# Patient Record
Sex: Male | Born: 1955 | Race: Black or African American | Hispanic: No | Marital: Married | Smoking: Never smoker
Health system: Southern US, Community
[De-identification: ages and names within clinical notes are randomized; demographics above are authoritative.]

## PROBLEM LIST (undated history)

## (undated) DIAGNOSIS — A150 Tuberculosis of lung: Secondary | ICD-10-CM

---

## 2008-07-27 ENCOUNTER — Emergency Department (HOSPITAL_COMMUNITY): Admission: EM | Admit: 2008-07-27 | Discharge: 2008-07-27 | Payer: Self-pay | Admitting: Emergency Medicine

## 2009-11-13 ENCOUNTER — Emergency Department (HOSPITAL_COMMUNITY): Admission: EM | Admit: 2009-11-13 | Discharge: 2009-11-13 | Payer: Self-pay | Admitting: Emergency Medicine

## 2009-12-26 ENCOUNTER — Emergency Department (HOSPITAL_COMMUNITY): Admission: EM | Admit: 2009-12-26 | Discharge: 2009-12-26 | Payer: Self-pay | Admitting: Emergency Medicine

## 2010-11-11 ENCOUNTER — Encounter: Payer: Self-pay | Admitting: Emergency Medicine

## 2011-03-24 ENCOUNTER — Emergency Department (HOSPITAL_COMMUNITY)
Admission: EM | Admit: 2011-03-24 | Discharge: 2011-03-24 | Disposition: A | Discharge status: Home / Self Care | Payer: BC Managed Care – PPO | Attending: Emergency Medicine | Admitting: Emergency Medicine

## 2011-03-24 DIAGNOSIS — M545 Low back pain, unspecified: Secondary | ICD-10-CM | POA: Insufficient documentation

## 2011-03-24 DIAGNOSIS — S335XXA Sprain of ligaments of lumbar spine, initial encounter: Secondary | ICD-10-CM | POA: Insufficient documentation

## 2011-03-24 DIAGNOSIS — X503XXA Overexertion from repetitive movements, initial encounter: Secondary | ICD-10-CM | POA: Insufficient documentation

## 2011-03-29 ENCOUNTER — Ambulatory Visit (INDEPENDENT_AMBULATORY_CARE_PROVIDER_SITE_OTHER): Payer: BC Managed Care – PPO

## 2011-03-29 ENCOUNTER — Inpatient Hospital Stay (INDEPENDENT_AMBULATORY_CARE_PROVIDER_SITE_OTHER)
Admission: RE | Admit: 2011-03-29 | Discharge: 2011-03-29 | Disposition: A | Discharge status: Home / Self Care | Payer: BC Managed Care – PPO | Source: Ambulatory Visit

## 2011-03-29 DIAGNOSIS — M799 Soft tissue disorder, unspecified: Secondary | ICD-10-CM

## 2011-03-29 DIAGNOSIS — M549 Dorsalgia, unspecified: Secondary | ICD-10-CM

## 2011-09-27 ENCOUNTER — Emergency Department (INDEPENDENT_AMBULATORY_CARE_PROVIDER_SITE_OTHER)
Admission: EM | Admit: 2011-09-27 | Discharge: 2011-09-27 | Disposition: A | Discharge status: Home / Self Care | Payer: BC Managed Care – PPO | Source: Home / Self Care | Attending: Emergency Medicine | Admitting: Emergency Medicine

## 2011-09-27 ENCOUNTER — Encounter: Payer: Self-pay | Admitting: *Deleted

## 2011-09-27 DIAGNOSIS — J069 Acute upper respiratory infection, unspecified: Secondary | ICD-10-CM

## 2011-09-27 MED ORDER — GUAIFENESIN-CODEINE 100-10 MG/5ML PO SYRP: 5.0000 mL | ORAL_SOLUTION | Freq: Three times a day (TID) | ORAL | Status: AC | PRN

## 2011-09-27 NOTE — ED Provider Notes (Addendum)
History     CSN: [id number] Arrival date & time: 09/27/2011 12:34 PM   First MD Initiated Contact with Patient 09/27/11 1215      Chief Complaint  Patient presents with  . Cough  . Chills  . Headache    (Consider location/radiation/quality/duration/timing/severity/associated sxs/prior treatment) HPI Comments: FOR 3 DAYS CONGESTED AND COUGHING UP YELLOW PHLEGM, HAVING SOME CHILLS NO SOB, some body aches and headaches".  Patient is a 55 y.o. male presenting with cough and headaches. The history is provided by the patient.  Cough This is a new problem. The current episode started more than 2 days ago. The problem occurs every few minutes. The cough is productive of sputum. The maximum temperature recorded prior to his arrival was 100 to 100.9 F. Associated symptoms include sweats, headaches, rhinorrhea and sore throat. Pertinent negatives include no chest pain, no ear pain, no shortness of breath and no wheezing. He has tried decongestants for the symptoms. The treatment provided mild relief. His past medical history does not include bronchitis, pneumonia, COPD, emphysema or asthma.  Headache The primary symptoms include headaches.    History reviewed. No pertinent past medical history.  History reviewed. No pertinent past surgical history.  History reviewed. No pertinent family history.  History  Substance Use Topics  . Smoking status: Never Smoker   . Smokeless tobacco: Not on file  . Alcohol Use: No      Review of Systems  Constitutional: Negative for diaphoresis and activity change.  HENT: Positive for sore throat and rhinorrhea. Negative for ear pain.   Respiratory: Positive for cough. Negative for shortness of breath and wheezing.   Cardiovascular: Negative for chest pain.  Neurological: Positive for headaches.    Allergies  Review of patient's allergies indicates no known allergies.  Home Medications  No current outpatient prescriptions on file.  There were  no vitals taken for this visit.  Physical Exam  Nursing note and vitals reviewed. Constitutional: He is oriented to person, place, and time. He appears well-developed and well-nourished.  HENT:  Head: Normocephalic.  Eyes: Pupils are equal, round, and reactive to light.  Neck: Normal range of motion. No JVD present.  Cardiovascular: Normal rate.   No murmur heard. Pulmonary/Chest: Effort normal and breath sounds normal. No respiratory distress. He has no wheezes.  Abdominal: Soft. He exhibits no distension.  Musculoskeletal: Normal range of motion.  Neurological: He is alert and oriented to person, place, and time.  Skin: Skin is warm. No erythema.    ED Course  Procedures (including critical care time)  Labs Reviewed - No data to display No results found.   No diagnosis found.    MDM  COUGH- X 3 DAYS WITH TACTILE FEVERS NO SOB- normal exam- mild ILI'S        Jimmie Molly, MD 09/27/11 1322    Jimmie Molly, MD 09/27/11 1329

## 2011-09-27 NOTE — ED Notes (Signed)
Pt is here with complaints of cough with yellow sputum, chills at night and HA X 3 days.

## 2012-01-18 IMAGING — CR DG CHEST 2V
2 series · 2 of 2 positions shown · non-contrast
Comparison: None

CLINICAL DATA: Productive cough and chest congestion.

CHEST - 2 VIEW

[w chest pa]
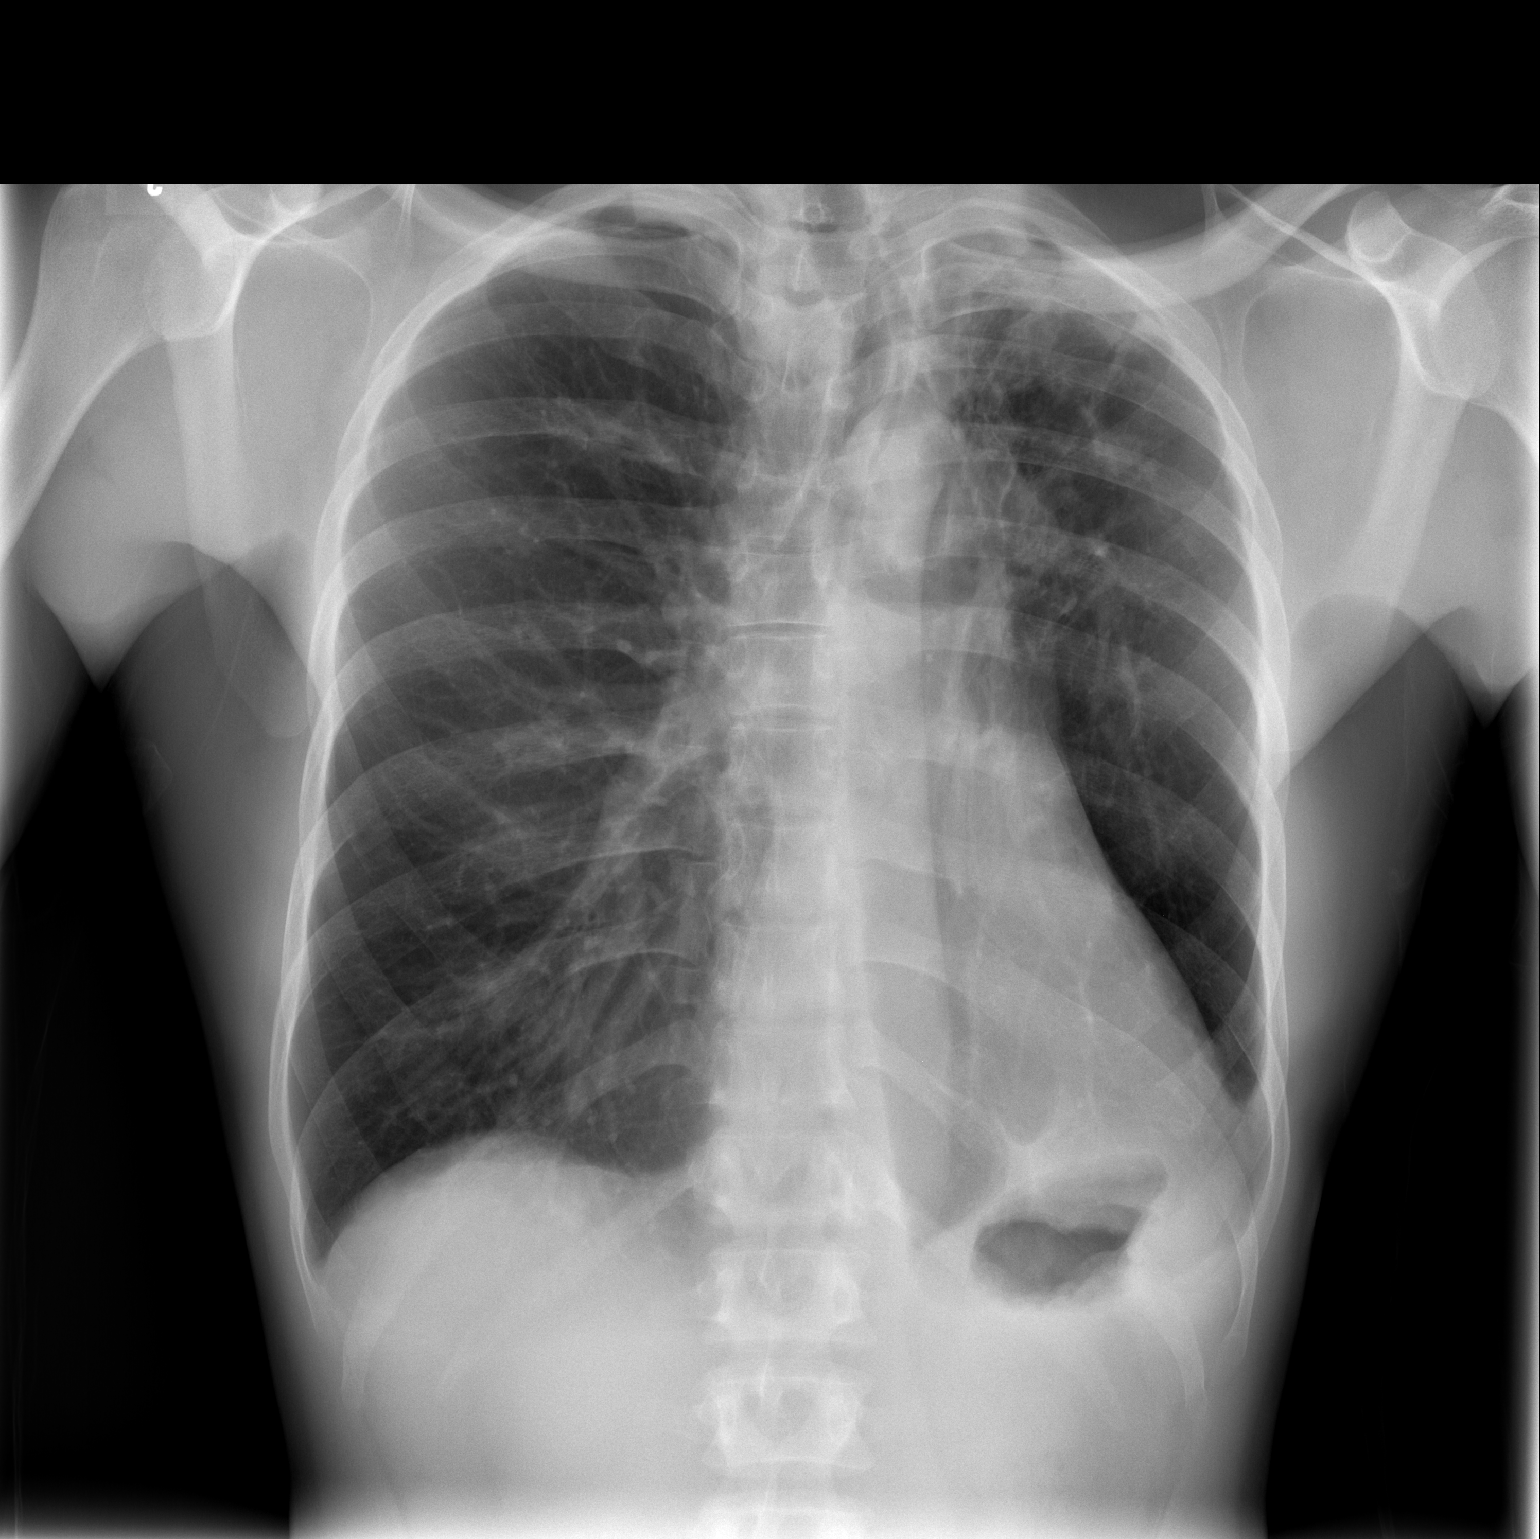

[w chest lat]
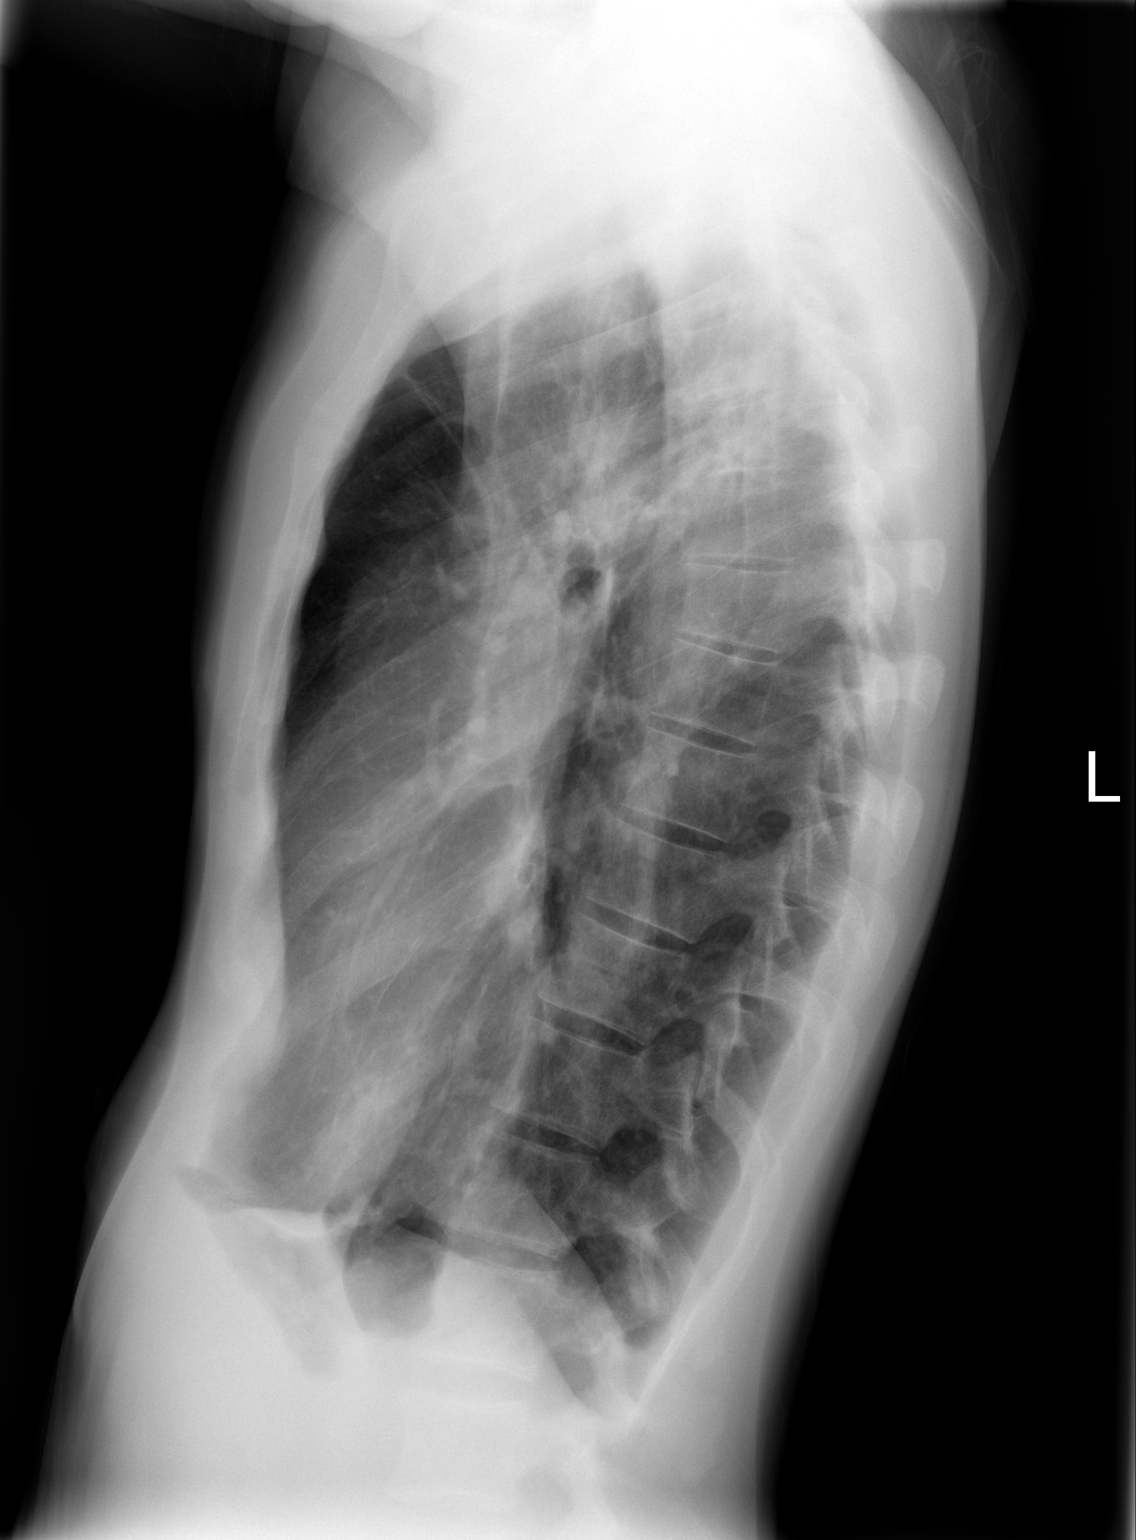

[2 of 2 positions shown; findings below may reference images not displayed]

FINDINGS: There is extensive scarring in the left upper lung zone
with some ill-defined vague nodular densities and superior
retraction of the left hilum and the left diaphragm.  The trachea
is retracted to the left.

Does the patient have a history of tuberculosis?

There is a vague 8 mm nodular appearing density at the right lung
base, nonspecific.

The heart size and pulmonary vascularity are normal.  No bony
abnormality.
IMPRESSION: 1.  Extensive scarring and possible acute infiltrate in the left
lung apex.  The possibility of tuberculosis should be considered.
2.  Small nodular density at the right lung base, nonspecific.  CT
scan may be helpful to further evaluate both processes.

## 2013-02-18 ENCOUNTER — Ambulatory Visit: Payer: Self-pay

## 2013-02-18 ENCOUNTER — Other Ambulatory Visit: Payer: Self-pay | Admitting: Occupational Medicine

## 2013-02-18 DIAGNOSIS — R52 Pain, unspecified: Secondary | ICD-10-CM

## 2014-08-29 ENCOUNTER — Emergency Department (HOSPITAL_COMMUNITY)
Admission: EM | Admit: 2014-08-29 | Discharge: 2014-08-29 | Disposition: A | Discharge status: Home / Self Care | Payer: BC Managed Care – PPO | Attending: Emergency Medicine | Admitting: Emergency Medicine

## 2014-08-29 ENCOUNTER — Encounter (HOSPITAL_COMMUNITY): Payer: Self-pay | Admitting: Neurology

## 2014-08-29 DIAGNOSIS — H6121 Impacted cerumen, right ear: Secondary | ICD-10-CM | POA: Insufficient documentation

## 2014-08-29 DIAGNOSIS — H9201 Otalgia, right ear: Secondary | ICD-10-CM

## 2014-08-29 NOTE — Discharge Instructions (Signed)
1. No q-tips in ears 2. Go to the pharmacy and use the OTC ear wax kits 3. Follow up with ENT (Dr. Lazarus SalinesWolicki) as needed 4. Come back if worsening symptoms Cerumen Impaction A cerumen impaction is when the wax in your ear forms a plug. This plug usually causes reduced hearing. Sometimes it also causes an earache or dizziness. Removing a cerumen impaction can be difficult and painful. The wax sticks to the ear canal. The canal is sensitive and bleeds easily. If you try to remove a heavy wax buildup with a cotton tipped swab, you may push it in further. Irrigation with water, suction, and small ear curettes may be used to clear out the wax. If the impaction is fixed to the skin in the ear canal, ear drops may be needed for a few days to loosen the wax. People who build up a lot of wax frequently can use ear wax removal products available in your local drugstore. SEEK MEDICAL CARE IF:  You develop an earache, increased hearing loss, or marked dizziness. Document Released: 11/14/2004 Document Revised: 12/30/2011 Document Reviewed: 01/04/2010 [facility] Patient Information 2015 [place], [place]. This information is not intended to replace advice given to you by your health care provider. Make sure you discuss any questions you have with your health care provider.

## 2014-08-29 NOTE — ED Notes (Signed)
Pt on continuous pulse oximetry and blood pressure cuff

## 2014-08-29 NOTE — ED Notes (Addendum)
Reports right ear pain since yesterday, no hearing loss. Has been using q-tips. Is a x 4

## 2014-08-29 NOTE — ED Provider Notes (Signed)
CSN: [id number]     Arrival date & time 08/29/14  [id number] History   First MD Initiated Contact with Patient 08/29/14 [id number]     No chief complaint on file.    (Consider location/radiation/quality/duration/timing/severity/associated sxs/prior Treatment) Patient is a 58 y.o. male presenting with ear pain. The history is provided by the patient. No language interpreter was used.  Otalgia Location:  Right Behind ear:  No abnormality Quality:  Aching and sharp Severity:  Moderate Onset quality:  Gradual Duration:  1 day Timing:  Constant Progression:  Unchanged Chronicity:  New Context: not direct blow, not elevation change, not foreign body in ear and no water in ear   Relieved by:  Nothing Worsened by:  Nothing tried Ineffective treatments:  None tried Associated symptoms: no abdominal pain, no congestion, no cough, no diarrhea, no ear discharge, no fever, no headaches, no hearing loss, no neck pain, no rash, no rhinorrhea, no sore throat, no tinnitus and no vomiting   Risk factors: no recent travel, no chronic ear infection and no prior ear surgery     No past medical history on file. No past surgical history on file. No family history on file. History  Substance Use Topics  . Smoking status: Never Smoker   . Smokeless tobacco: Not on file  . Alcohol Use: No    Review of Systems  Constitutional: Negative for fever.  HENT: Positive for ear pain. Negative for congestion, ear discharge, hearing loss, rhinorrhea, sore throat and tinnitus.   Respiratory: Negative for cough and shortness of breath.   Cardiovascular: Negative for chest pain.  Gastrointestinal: Negative for nausea, vomiting, abdominal pain and diarrhea.  Genitourinary: Negative for dysuria and hematuria.  Musculoskeletal: Negative for neck pain.  Skin: Negative for rash.  Neurological: Negative for syncope, light-headedness and headaches.  All other systems reviewed and are negative.     Allergies  Review of  patient's allergies indicates no known allergies.  Home Medications   Prior to Admission medications   Not on File   BP 130/79 mmHg  Pulse 77  Temp(Src) 97.7 F (36.5 C) (Oral)  Resp 18  SpO2 97% Physical Exam  Constitutional: He is oriented to person, place, and time. He appears well-developed and well-nourished.  HENT:  Head: Normocephalic and atraumatic.  Right Ear: External ear normal.  Left Ear: External ear normal.  Eyes: EOM are normal.  Neck: Normal range of motion. Neck supple.  Cardiovascular: Normal rate, regular rhythm and intact distal pulses.  Exam reveals no gallop and no friction rub.   No murmur heard. Pulmonary/Chest: Effort normal and breath sounds normal. No respiratory distress. He has no wheezes. He has no rales. He exhibits no tenderness.  Abdominal: Soft. Bowel sounds are normal. He exhibits no distension. There is no tenderness. There is no rebound.  Musculoskeletal: Normal range of motion. He exhibits no edema or tenderness.  Lymphadenopathy:    He has no cervical adenopathy.  Neurological: He is alert and oriented to person, place, and time.  Skin: Skin is warm. No rash noted.  Psychiatric: He has a normal mood and affect. His behavior is normal.  Nursing note and vitals reviewed.   ED Course  Procedures (including critical care time) Labs Review Labs Reviewed - No data to display  Imaging Review No results found.   EKG Interpretation None      MDM   Final diagnoses:  None    8:35 AM Pt is a 58 y.o. male with no pertinent PMHX who  presents to the ED with right ear pain. No URI or fever. No hearing loss. No focal neurologic abnormalities. Endorses right sided ear pain that started yesterday. Using q-tips on his ear. No trauma or injury. No swimming. No syncope or other symptoms. No history of immunosuppression  On exam: well appearing, no cervical adenopathy. No erythema of canal to suggest otitis externa. No adenopathy or infectious  symptoms to suggest otitis media. Noted cerumen impaction in right ear. Plan to have patient use OTC cerumen kits and avoid using q-tips in the future. No synocpe or neurologic dysfunction to suggest stroke or carotid bruits to suggest stenosis. No mastoid tenderness  Plan for discharge with close follow up with ENT as needed. Strict return precautions given.   8:43 AM:  I have discussed the diagnosis/risks/treatment options with the patient and believe the pt to be eligible for discharge home to follow-up with PCP and ENT as needed. We also discussed returning to the ED immediately if new or worsening sx occur. We discussed the sx which are most concerning (e.g., worsening symptoms) that necessitate immediate return. Any new prescriptions provided to the patient are listed below.   New Prescriptions   No medications on file    The patient appears reasonably screened and/or stabilized for discharge and I doubt any other medical condition or other [facility] requiring further screening, evaluation or treatment in the ED at this time prior to discharge . Pt in agreement with discharge plan. Return precautions given. Pt discharged VSS  Pt was discussed with my attending, Dr. Alessandra Bevelsampos     Brenlyn Beshara Peter Tsuyako Jolley, MD 08/29/14 1512  Lyanne CoKevin M Campos, MD 08/29/14 [phone]

## 2014-11-25 ENCOUNTER — Emergency Department (HOSPITAL_COMMUNITY)
Admission: EM | Admit: 2014-11-25 | Discharge: 2014-11-25 | Disposition: A | Discharge status: Home / Self Care | Payer: BC Managed Care – PPO | Attending: Emergency Medicine | Admitting: Emergency Medicine

## 2014-11-25 ENCOUNTER — Encounter (HOSPITAL_COMMUNITY): Payer: Self-pay | Admitting: Emergency Medicine

## 2014-11-25 ENCOUNTER — Emergency Department (HOSPITAL_COMMUNITY): Payer: BC Managed Care – PPO

## 2014-11-25 DIAGNOSIS — R059 Cough, unspecified: Secondary | ICD-10-CM

## 2014-11-25 DIAGNOSIS — R05 Cough: Secondary | ICD-10-CM | POA: Diagnosis not present

## 2014-11-25 DIAGNOSIS — IMO0002 Reserved for concepts with insufficient information to code with codable children: Secondary | ICD-10-CM

## 2014-11-25 DIAGNOSIS — M779 Enthesopathy, unspecified: Secondary | ICD-10-CM | POA: Diagnosis not present

## 2014-11-25 DIAGNOSIS — A150 Tuberculosis of lung: Secondary | ICD-10-CM | POA: Insufficient documentation

## 2014-11-25 HISTORY — DX: Tuberculosis of lung: A15.0

## 2014-11-25 MED ORDER — NAPROXEN 500 MG PO TABS: 500.0000 mg | ORAL_TABLET | Freq: Two times a day (BID) | ORAL | Status: DC

## 2014-11-25 MED ORDER — ALBUTEROL SULFATE HFA 108 (90 BASE) MCG/ACT IN AERS: 2.0000 | INHALATION_SPRAY | RESPIRATORY_TRACT | Status: AC | PRN

## 2014-11-25 MED ORDER — BENZONATATE 100 MG PO CAPS: 100.0000 mg | ORAL_CAPSULE | Freq: Three times a day (TID) | ORAL | Status: AC

## 2014-11-25 NOTE — ED Provider Notes (Signed)
CSN: [id number]     Arrival date & time 11/25/14  [id number] History  This chart was scribed for Jaynie Crumble, PA-C, working with Derwood Kaplan, MD by Chestine Spore, ED Scribe. The patient was seen in room TR09C/TR09C at 9:17 AM.    Chief Complaint  Patient presents with  . Arm Pain  . URI     The history is provided by the patient. No language interpreter was used.    HPI Comments: Jerome Hampton is a 59 y.o. male who presents to the Emergency Department complaining of arm pain onset 1 week. He denies any injury. 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 He notes that the majority of his pain is in his elbow. He states that he is having associated symptoms of productive cough. He reports that his cough is productive of yellow sputum. He states that he has not tried any medications for his symptoms. He denies wrist pain, SOB, and any other symptoms. 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 He denies being a smoker.    History reviewed. No pertinent past medical history. History reviewed. No pertinent past surgical history. No family history on file. History  Substance Use Topics  . Smoking status: Never Smoker   . Smokeless tobacco: Not on file  . Alcohol Use: No    Review of Systems  Respiratory: Positive for cough. Negative for shortness of breath.   Musculoskeletal: Positive for arthralgias.  All other systems reviewed and are negative.     Allergies  Review of patient's allergies indicates no known allergies.  Home Medications   Prior to Admission medications   Not on File   BP 117/70 mmHg  Pulse 77  Temp(Src) 97.5 F (36.4 C) (Oral)  SpO2 100%  Physical Exam  Constitutional: He is oriented to person, place, and time. He appears well-developed and well-nourished. No distress.  HENT:  Head: Normocephalic and atraumatic.  Eyes: EOM are normal.  Neck: Neck supple. No tracheal deviation present.  Cardiovascular: Normal rate.    Pulmonary/Chest: Effort normal and breath sounds normal. No respiratory distress.  Musculoskeletal: Normal range of motion.  Right forearm and upper arm appeared to be normal. Elbow and wrist are normal. There is mild tenderness to palpation over bilateral forearm just distal to the elbow. There is no pain with range of motion of the elbow. There is pain with flexion at the elbow against resistance. No pain with extension of the elbow against resistance. Pain with forearm supination against resistance. There is no swelling, and distal radial pulses intact.  Neurological: He is alert and oriented to person, place, and time.  Skin: Skin is warm and dry.  Psychiatric: He has a normal mood and affect. His behavior is normal.  Nursing note and vitals reviewed.   ED Course  Procedures (including critical care time) DIAGNOSTIC STUDIES: Oxygen Saturation is 100% on room air, normal by my interpretation.    COORDINATION OF CARE: 9:23 AM-Discussed treatment plan which includes CXR, anti-inflammatory, and ace wrap with pt at bedside and pt agreed to plan.   Labs Review Labs Reviewed - No data to display  Imaging Review Dg Chest 2 View  11/25/2014   CLINICAL DATA:  Right arm pain, productive cough starting 1 week ago, history of TB  EXAM: CHEST  2 VIEW  COMPARISON:  11/13/2009  FINDINGS: Cardiomediastinal silhouette is stable. Again noted extensive pleural parenchymal scarring in left apex and left upper lobe medially.  No definite superimposed infiltrate or pulmonary edema. Hyperinflation again noted. Stable chronic blunting of the left costophrenic angle.  IMPRESSION: Hyperinflation again noted. Stable extensive pleural parenchymal scarring left apex in left upper lobe. Stable chronic blunting of left costophrenic angle. No definite superimposed infiltrate or pulmonary edema.   Electronically Signed   By: Natasha MeadLiviu  Pop M.D.   On: 11/25/2014 09:45     EKG Interpretation None      MDM   Final  diagnoses:  Tendonitis of elbow or forearm  Cough    patient is here with right forearm pain for a week, also with cough. I believe his forearm pain is from overuse at work. Possible tendonitis. No evidence of infection, no bony tenderness, full rom. No concern for dvt. 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 Most likely viral bronchitis. Home with inhaler, tessalon. He is afebrile, non toxic appearing. No distress.   Filed Vitals:   11/25/14 0900  BP: 117/70  Pulse: 77  Temp: 97.5 F (36.4 C)  TempSrc: Oral  SpO2: 100%    I personally performed the services described in this documentation, which was scribed in my presence. The recorded information has been reviewed and is accurate.    Lottie Musselatyana A Rael Tilly, PA-C 11/25/14 1121  Derwood KaplanAnkit Nanavati, MD 11/29/14 1106

## 2014-11-25 NOTE — ED Notes (Signed)
C/o right arm pain x 1 week. No known injury. Pain primarily in elbow. Also, c/o "coughing up cold" points to throat but denies sore throat or significant cough. Pt works as a [employer] doing some heavy lifting.

## 2014-11-25 NOTE — Discharge Instructions (Signed)
Rest your forearm, avoid heavy lifting, pushing, pulling for 1-2 wks. Ice. Elevate. Naprosyn for pain and inflammation. Take inhaler for cough. Tessalon for cough as needed. I think your cough is caused by a virus. Follow up with primary care doctor.    Cough, Adult  A cough is a reflex that helps clear your throat and airways. It can help heal the body or may be a reaction to an irritated airway. A cough may only last 2 or 3 weeks (acute) or may last more than 8 weeks (chronic).  CAUSES Acute cough:  Viral or bacterial infections. Chronic cough:  Infections.  Allergies.  Asthma.  Post-nasal drip.  Smoking.  Heartburn or acid reflux.  Some medicines.  Chronic lung problems (COPD).  Cancer. SYMPTOMS   Cough.  Fever.  Chest pain.  Increased breathing rate.  High-pitched whistling sound when breathing (wheezing).  Colored mucus that you cough up (sputum). TREATMENT   A bacterial cough may be treated with antibiotic medicine.  A viral cough must run its course and will not respond to antibiotics.  Your caregiver may recommend other treatments if you have a chronic cough. HOME CARE INSTRUCTIONS   Only take over-the-counter or prescription medicines for pain, discomfort, or fever as directed by your caregiver. Use cough suppressants only as directed by your caregiver.  Use a cold steam vaporizer or humidifier in your bedroom or home to help loosen secretions.  Sleep in a semi-upright position if your cough is worse at night.  Rest as needed.  Stop smoking if you smoke. SEEK IMMEDIATE MEDICAL CARE IF:   You have pus in your sputum.  Your cough starts to worsen.  You cannot control your cough with suppressants and are losing sleep.  You begin coughing up blood.  You have difficulty breathing.  You develop pain which is getting worse or is uncontrolled with medicine.  You have a fever. MAKE SURE YOU:   Understand these instructions.  Will watch  your condition.  Will get help right away if you are not doing well or get worse. Document Released: 04/05/2011 Document Revised: 12/30/2011 Document Reviewed: 04/05/2011 [facility] Patient Information 2015 [place], [place]. This information is not intended to replace advice given to you by your health care provider. Make sure you discuss any questions you have with your health care provider.  Tendinitis Tendinitis is swelling and inflammation of the tendons. Tendons are band-like tissues that connect muscle to bone. Tendinitis commonly occurs in the:   Shoulders (rotator cuff).  Heels (Achilles tendon).  Elbows (triceps tendon). CAUSES Tendinitis is usually caused by overusing the tendon, muscles, and joints involved. When the tissue surrounding a tendon (synovium) becomes inflamed, it is called tenosynovitis. Tendinitis commonly develops in people whose jobs require repetitive motions. SYMPTOMS  Pain.  Tenderness.  Mild swelling. DIAGNOSIS Tendinitis is usually diagnosed by physical exam. Your health care provider may also order X-rays or other imaging tests. TREATMENT Your health care provider may recommend certain medicines or exercises for your treatment. HOME CARE INSTRUCTIONS   Use a sling or splint for as long as directed by your health care provider until the pain decreases.  Put ice on the injured area.  Put ice in a plastic bag.  Place a towel between your skin and the bag.  Leave the ice on for 15-20 minutes, 3-4 times a day, or as directed by your health care provider.  Avoid using the limb while the tendon is painful. Perform gentle range of motion exercises only  as directed by your health care provider. Stop exercises if pain or discomfort increase, unless directed otherwise by your health care provider.  Only take over-the-counter or prescription medicines for pain, discomfort, or fever as directed by your health care provider. SEEK MEDICAL CARE IF:   Your  pain and swelling increase.  You develop new, unexplained symptoms, especially increased numbness in the hands. MAKE SURE YOU:   Understand these instructions.  Will watch your condition.  Will get help right away if you are not doing well or get worse. Document Released: 10/04/2000 Document Revised: 02/21/2014 Document Reviewed: 12/24/2010 [facility] Patient Information 2015 [place], [place]. This information is not intended to replace advice given to you by your health care provider. Make sure you discuss any questions you have with your health care provider.

## 2015-07-25 ENCOUNTER — Encounter: Payer: Self-pay | Admitting: Emergency Medicine

## 2020-08-07 ENCOUNTER — Emergency Department (HOSPITAL_COMMUNITY)
Admission: EM | Admit: 2020-08-07 | Discharge: 2020-08-07 | Disposition: A | Discharge status: Home / Self Care | Payer: 59 | Attending: Emergency Medicine | Admitting: Emergency Medicine

## 2020-08-07 ENCOUNTER — Encounter (HOSPITAL_COMMUNITY): Payer: Self-pay

## 2020-08-07 DIAGNOSIS — U071 COVID-19: Secondary | ICD-10-CM | POA: Diagnosis not present

## 2020-08-07 DIAGNOSIS — R6883 Chills (without fever): Secondary | ICD-10-CM | POA: Diagnosis present

## 2020-08-07 LAB — RESPIRATORY PANEL BY RT PCR (FLU A&B, COVID)
Influenza A by PCR: NEGATIVE
Influenza B by PCR: NEGATIVE
SARS Coronavirus 2 by RT PCR: POSITIVE — AB

## 2020-08-07 NOTE — Discharge Instructions (Addendum)
At this time there does not appear to be the presence of an emergent medical condition, however there is always the potential for conditions to change. Please read and follow the below instructions.  Please return to the Emergency Department immediately for any new or worsening symptoms. Please be sure to follow up with your Primary Care Provider within one week regarding your visit today; please call their office to schedule an appointment even if you are feeling better for a follow-up visit. You should receive a call from the monoclonal antibody infusion center either this evening or sometime tomorrow.  Please keep your phone on you and answer it when they call it will be from an unknown number.  If you qualify they may schedule you an appointment for infusion. As you are positive for COVID-19 please stay away from other people and avoid large crowds to stop the spread of this virus.  Please wear your mask when around others.  You must quarantine for 10 days which would be on October 28.  Go to the nearest Emergency Department immediately if: You have fever or chills You have trouble breathing. You have pain or pressure in your chest. You have confusion. You have bluish lips and fingernails. You have difficulty waking from sleep. You have any new/concerning or worsening of symptoms  Please read the additional information packets attached to your discharge summary.  Do not take your medicine if  develop an itchy rash, swelling in your mouth or lips, or difficulty breathing; call 911 and seek immediate emergency medical attention if this occurs.  You may review your lab tests and imaging results in their entirety on your MyChart account.  Please discuss all results of fully with your primary care provider and other specialist at your follow-up visit.  Note: Portions of this text may have been transcribed using voice recognition software. Every effort was made to ensure accuracy; however,  inadvertent computerized transcription errors may still be present.

## 2020-08-07 NOTE — ED Provider Notes (Signed)
[facility] EMERGENCY DEPARTMENT Provider Note   CSN: [id number] Arrival date & time: 08/07/20  0745     History Chief Complaint  Patient presents with  . Chills    Jerome Hampton is a 64 y.o. male occasional smoker otherwise healthy no daily medication use.  Patient reports that his daughter was positive for COVID-19 last week.  Patient reports that he had been feeling well but last night developed a single chill which prompted him to take a Covid test at home which was then positive.  Since that single chill yesterday he has been asymptomatic reports that he is feeling very well today he is awaiting his Covid test result.  Denies fever, headache, sore throat, loss of taste/smell, cough/shortness of breath, chest pain, abdominal pain, nausea/vomiting, diarrhea, extremity swelling/color change, body aches or any additional concerns.  HPI     Past Medical History:  Diagnosis Date  . TB (pulmonary tuberculosis)    Pt reports having active TB when he was younger    Patient Active Problem List   Diagnosis Date Noted  . TB (pulmonary tuberculosis)     History reviewed. No pertinent surgical history.     No family history on file.  Social History   Tobacco Use  . Smoking status: Never Smoker  Substance Use Topics  . Alcohol use: No  . Drug use: No    Home Medications Prior to Admission medications   Medication Sig Start Date End Date Taking? Authorizing Provider  albuterol (PROVENTIL HFA;VENTOLIN HFA) 108 (90 BASE) MCG/ACT inhaler Inhale 2 puffs into the lungs every 4 (four) hours as needed for wheezing or shortness of breath. 11/25/14   Kirichenko, Tatyana, PA-C  benzonatate (TESSALON) 100 MG capsule Take 1 capsule (100 mg total) by mouth every 8 (eight) hours. 11/25/14   Kirichenko, Lemont Fillers, PA-C  naproxen (NAPROSYN) 500 MG tablet Take 1 tablet (500 mg total) by mouth 2 (two) times daily. 11/25/14   Jaynie Crumble, PA-C    Allergies    Patient  has no known allergies.  Review of Systems   Review of Systems  Constitutional: Positive for chills (Single chill yesterday). Negative for fatigue and fever.  HENT: Negative.  Negative for sore throat.   Respiratory: Negative.  Negative for cough and shortness of breath.   Cardiovascular: Negative.  Negative for chest pain and leg swelling.  Gastrointestinal: Negative.  Negative for abdominal pain, diarrhea, nausea and vomiting.  Musculoskeletal: Negative.  Negative for arthralgias and myalgias.  Neurological: Negative.  Negative for headaches.    Physical Exam Updated Vital Signs BP 122/81 (BP Location: Right Arm)   Pulse 90   Temp 98.6 F (37 C) (Oral)   Resp 17   Ht 5\' 11"  (1.803 m)   Wt 65.3 kg   SpO2 100%   BMI 20.08 kg/m   Physical Exam Constitutional:      General: He is not in acute distress.    Appearance: Normal appearance. He is well-developed. He is not ill-appearing or diaphoretic.  HENT:     Head: Normocephalic and atraumatic.  Eyes:     General: Vision grossly intact. Gaze aligned appropriately.     Pupils: Pupils are equal, round, and reactive to light.  Neck:     Trachea: Trachea and phonation normal.  Cardiovascular:     Rate and Rhythm: Normal rate and regular rhythm.  Pulmonary:     Effort: Pulmonary effort is normal. No respiratory distress.     Breath sounds: Normal  breath sounds.  Abdominal:     General: There is no distension.     Palpations: Abdomen is soft.     Tenderness: There is no abdominal tenderness. There is no guarding or rebound.  Musculoskeletal:        General: Normal range of motion.     Cervical back: Normal range of motion.     Right lower leg: No edema.     Left lower leg: No edema.  Skin:    General: Skin is warm and dry.  Neurological:     Mental Status: He is alert.     GCS: GCS eye subscore is 4. GCS verbal subscore is 5. GCS motor subscore is 6.     Comments: Speech is clear and goal oriented, follows  commands Major Cranial nerves without deficit, no facial droop Moves extremities without ataxia, coordination intact  Psychiatric:        Behavior: Behavior normal.     ED Results / Procedures / Treatments   Labs (all labs ordered are listed, but only abnormal results are displayed) Labs Reviewed  RESPIRATORY PANEL BY RT PCR (FLU A&B, COVID) - Abnormal; Notable for the following components:      Result Value   SARS Coronavirus 2 by RT PCR POSITIVE (*)    All other components within normal limits    EKG None  Radiology No results found.  Procedures Procedures (including critical care time)  Medications Ordered in ED Medications - No data to display  ED Course  I have reviewed the triage vital signs and the nursing notes.  Pertinent labs & imaging results that were available during my care of the patient were reviewed by me and considered in my medical decision making (see chart for details).  Clinical Course as of Aug 08 1631  Mon Aug 07, 2020  1600 Brion Aliment NP   [BM]    Clinical Course User Index [BM] Elizabeth Palau   MDM Rules/Calculators/A&P                          Additional history obtained from: 1. Nursing notes from this visit. ----------------------- 64 year old male presented after having a positive home Covid test yesterday he is here for a confirmatory Covid test.  He tested himself after his daughter was positive for Covid last week and he experienced a single chill last night.  He is otherwise completely asymptomatic.  He reports he is feeling very well at the time my evaluation he has been waiting for room for over 8 hours.  The Covid test obtained on arrival is positive.  He denies any cough chest pain shortness of breath or any other symptoms today reports that he is feeling well.  On exam cranial nerves intact, no meningeal signs, airway is clear, cardiopulmonary examination within normal limits, abdomen soft nontender, neurovascular tact to all  4 extremities no evidence of DVT.  I discussed plan of care with patient, given lack of respiratory symptoms and reassuring cardiopulmonary exam low suspicion for pneumonia at this time do not feel patient would benefit from a chest x-ray at this time.  Patient is requesting discharge.  Will refer patient to monoclonal antibody infusion center given his age and history of occasional smoking, no other high risk factors.  He does not meet criteria for admission.  Vital signs at discharge are within normal limits without fever, tachycardia, hypoxia or hypotension.  He was noted to be tachycardic on arrival  this morning this resolved without intervention, he denies any fatigue chest pain or shortness of breath.  Doubt bacterial infection requiring antibiotics or ACS/PE or other emergent cardiothoracic pathologies at this time.  Suspect patient symptoms secondary to COVID-19 viral infection no further work-up is indicated at this time. - Secure chat with Nicolasa Ducking, NP, they will reach out to patient this evening or tomorrow to schedule monoclonal antibody infusion.  At this time there does not appear to be any evidence of an acute emergency medical condition and the patient appears stable for discharge with appropriate outpatient follow up. Diagnosis was discussed with patient who verbalizes understanding of care plan and is agreeable to discharge. I have discussed return precautions with patient who verbalizes understanding. Patient encouraged to follow-up with their PCP. All questions answered.  Patient's case discussed with Dr. Effie Shy who agrees with plan to discharge with follow-up.   Jerome Hampton was evaluated in Emergency Department on 08/07/2020 for the symptoms described in the history of present illness. He was evaluated in the context of the global COVID-19 pandemic, which necessitated consideration that the patient might be at risk for infection with the SARS-CoV-2 virus that causes  COVID-19. Institutional protocols and algorithms that pertain to the evaluation of patients at risk for COVID-19 are in a state of rapid change based on information released by regulatory bodies including the CDC and federal and state organizations. These policies and algorithms were followed during the patient's care in the ED.  Note: Portions of this report may have been transcribed using voice recognition software. Every effort was made to ensure accuracy; however, inadvertent computerized transcription errors may still be present. Final Clinical Impression(s) / ED Diagnoses Final diagnoses:  COVID-19 virus infection    Rx / DC Orders ED Discharge Orders    None       Elizabeth Palau 08/07/20 1633    Mancel Bale, MD 08/08/20 [phone]

## 2020-08-07 NOTE — ED Triage Notes (Signed)
Pt reports he felt a chill last night and took a home COVID test that resulted positive. Pt denies cough, sob, chest pain or any other symptoms. Pt a.o, resp e.u

## 2020-08-08 ENCOUNTER — Emergency Department (HOSPITAL_COMMUNITY)
Admission: EM | Admit: 2020-08-08 | Discharge: 2020-08-08 | Disposition: A | Discharge status: Home / Self Care | Payer: 59 | Attending: Emergency Medicine | Admitting: Emergency Medicine

## 2020-08-08 ENCOUNTER — Emergency Department (HOSPITAL_COMMUNITY): Payer: 59

## 2020-08-08 ENCOUNTER — Encounter (HOSPITAL_COMMUNITY): Payer: Self-pay

## 2020-08-08 ENCOUNTER — Other Ambulatory Visit: Payer: Self-pay

## 2020-08-08 DIAGNOSIS — R079 Chest pain, unspecified: Secondary | ICD-10-CM

## 2020-08-08 DIAGNOSIS — U071 COVID-19: Secondary | ICD-10-CM | POA: Insufficient documentation

## 2020-08-08 DIAGNOSIS — R0789 Other chest pain: Secondary | ICD-10-CM | POA: Diagnosis not present

## 2020-08-08 DIAGNOSIS — R6883 Chills (without fever): Secondary | ICD-10-CM | POA: Diagnosis present

## 2020-08-08 LAB — CBC
HCT: 36.4 % — ABNORMAL LOW (ref 39.0–52.0)
Hemoglobin: 11.7 g/dL — ABNORMAL LOW (ref 13.0–17.0)
MCH: 23.7 pg — ABNORMAL LOW (ref 26.0–34.0)
MCHC: 32.1 g/dL (ref 30.0–36.0)
MCV: 73.8 fL — ABNORMAL LOW (ref 80.0–100.0)
Platelets: 174 10*3/uL (ref 150–400)
RBC: 4.93 MIL/uL (ref 4.22–5.81)
RDW: 14.8 % (ref 11.5–15.5)
WBC: 5.2 10*3/uL (ref 4.0–10.5)
nRBC: 0 % (ref 0.0–0.2)

## 2020-08-08 LAB — BASIC METABOLIC PANEL
Anion gap: 12 (ref 5–15)
BUN: 23 mg/dL (ref 8–23)
CO2: 27 mmol/L (ref 22–32)
Calcium: 9.4 mg/dL (ref 8.9–10.3)
Chloride: 99 mmol/L (ref 98–111)
Creatinine, Ser: 1.48 mg/dL — ABNORMAL HIGH (ref 0.61–1.24)
GFR, Estimated: 50 mL/min — ABNORMAL LOW (ref >60)
Glucose, Bld: 114 mg/dL — ABNORMAL HIGH (ref 70–99)
Potassium: 4.1 mmol/L (ref 3.5–5.1)
Sodium: 138 mmol/L (ref 135–145)

## 2020-08-08 LAB — TROPONIN I (HIGH SENSITIVITY)
Troponin I (High Sensitivity): 36 ng/L — ABNORMAL HIGH (ref <18)
Troponin I (High Sensitivity): 31 ng/L — ABNORMAL HIGH (ref <18)

## 2020-08-08 LAB — RESPIRATORY PANEL BY RT PCR (FLU A&B, COVID)
Influenza A by PCR: NEGATIVE
Influenza B by PCR: NEGATIVE
SARS Coronavirus 2 by RT PCR: POSITIVE — AB

## 2020-08-08 NOTE — ED Triage Notes (Signed)
Patient c/o intermittent mid chest pain since yesterday. Patient reports that he had a Covid + home test yesterday. Patient denies any SOB or cough.

## 2020-08-08 NOTE — ED Notes (Signed)
I did EKG in triage at 10:05

## 2020-08-08 NOTE — Discharge Instructions (Addendum)
The monoclonal antibody infusion team will contact you to set up a time to get your infusion.  They have your information.  In the meantime I would recommend a multivitamin over-the-counter vitamin D and zinc supplement.  Tylenol as needed for the chills.  Return for any new or worse symptoms or for any difficulty breathing or increased shortness of breath.  Chest x-ray today with may be some early changes of Covid infection.  But your oxygen levels are fine.  Work-up for the chest pain without any significant abnormalities.

## 2020-08-08 NOTE — ED Provider Notes (Signed)
[facility]-EMERGENCY DEPT Provider Note   CSN: [id number] Arrival date & time: 08/08/20  3532     History Chief Complaint  Patient presents with  . Chest Pain    Jerome Hampton is a 64 y.o. male.  Patient with symptoms consistent with possible Covid infection starting 3 days ago.  Chills and discomfort in the chest.  Patient's daughter with positive diagnosis.  Patient had a positive diagnosis yesterday.  But I cannot see it in the computer system so we will recheck today.  Patient with a past history of tuberculosis when he was a child.  But apparently its been dormant.  Patient was also seen yesterday October 18 in the emergency department.  No labs were done at that time.  Patient is oxygen saturation is in the upper 90s here.        Past Medical History:  Diagnosis Date  . TB (pulmonary tuberculosis)    Pt reports having active TB when he was younger    Patient Active Problem List   Diagnosis Date Noted  . TB (pulmonary tuberculosis)     History reviewed. No pertinent surgical history.     Family History  Problem Relation Age of Onset  . Hypertension Mother     Social History   Tobacco Use  . Smoking status: Never Smoker  . Smokeless tobacco: Never Used  Vaping Use  . Vaping Use: Never used  Substance Use Topics  . Alcohol use: No  . Drug use: No    Home Medications Prior to Admission medications   Medication Sig Start Date End Date Taking? Authorizing Provider  acetaminophen (TYLENOL) 325 MG tablet Take 650 mg by mouth every 6 (six) hours as needed for mild pain, fever or headache.   Yes [provider]  albuterol (PROVENTIL HFA;VENTOLIN HFA) 108 (90 BASE) MCG/ACT inhaler Inhale 2 puffs into the lungs every 4 (four) hours as needed for wheezing or shortness of breath. Patient not taking: Reported on 08/08/2020 11/25/14   Jaynie Crumble, PA-C  benzonatate (TESSALON) 100 MG capsule Take 1 capsule (100 mg total) by  mouth every 8 (eight) hours. Patient not taking: Reported on 08/08/2020 11/25/14   Jaynie Crumble, PA-C  naproxen (NAPROSYN) 500 MG tablet Take 1 tablet (500 mg total) by mouth 2 (two) times daily. Patient not taking: Reported on 08/08/2020 11/25/14   Jaynie Crumble, PA-C    Allergies    Patient has no known allergies.  Review of Systems   Review of Systems  Constitutional: Positive for chills. Negative for fever.  HENT: Negative for congestion, rhinorrhea and sore throat.   Eyes: Negative for visual disturbance.  Respiratory: Negative for cough and shortness of breath.   Cardiovascular: Positive for chest pain. Negative for leg swelling.  Gastrointestinal: Negative for abdominal pain, diarrhea, nausea and vomiting.  Genitourinary: Negative for dysuria.  Musculoskeletal: Negative for back pain and neck pain.  Skin: Negative for rash.  Neurological: Negative for dizziness, light-headedness and headaches.  Hematological: Does not bruise/bleed easily.  Psychiatric/Behavioral: Negative for confusion.    Physical Exam Updated Vital Signs BP 116/85   Pulse 80   Temp 98.1 F (36.7 C) (Oral)   Resp 20   Ht 1.803 m (5\' 11" )   Wt 64.9 kg   SpO2 100%   BMI 19.94 kg/m   Physical Exam Vitals and nursing note reviewed.  Constitutional:      Appearance: He is well-developed.  HENT:     Head: Normocephalic and atraumatic.  Eyes:     Extraocular Movements: Extraocular movements intact.     Conjunctiva/sclera: Conjunctivae normal.     Pupils: Pupils are equal, round, and reactive to light.  Cardiovascular:     Rate and Rhythm: Normal rate and regular rhythm.     Heart sounds: No murmur heard.   Pulmonary:     Effort: Pulmonary effort is normal. No respiratory distress.     Breath sounds: Normal breath sounds.  Abdominal:     Palpations: Abdomen is soft.     Tenderness: There is no abdominal tenderness.  Musculoskeletal:        General: No swelling. Normal range of  motion.     Cervical back: Normal range of motion and neck supple.  Skin:    General: Skin is warm and dry.     Capillary Refill: Capillary refill takes less than 2 seconds.  Neurological:     General: No focal deficit present.     Mental Status: He is alert and oriented to person, place, and time.     Cranial Nerves: No cranial nerve deficit.     Sensory: No sensory deficit.     Motor: No weakness.     ED Results / Procedures / Treatments   Labs (all labs ordered are listed, but only abnormal results are displayed) Labs Reviewed  RESPIRATORY PANEL BY RT PCR (FLU A&B, COVID) - Abnormal; Notable for the following components:      Result Value   SARS Coronavirus 2 by RT PCR POSITIVE (*)    All other components within normal limits  BASIC METABOLIC PANEL - Abnormal; Notable for the following components:   Glucose, Bld 114 (*)    Creatinine, Ser 1.48 (*)    GFR, Estimated 50 (*)    All other components within normal limits  CBC - Abnormal; Notable for the following components:   Hemoglobin 11.7 (*)    HCT 36.4 (*)    MCV 73.8 (*)    MCH 23.7 (*)    All other components within normal limits  TROPONIN I (HIGH SENSITIVITY) - Abnormal; Notable for the following components:   Troponin I (High Sensitivity) 36 (*)    All other components within normal limits  TROPONIN I (HIGH SENSITIVITY) - Abnormal; Notable for the following components:   Troponin I (High Sensitivity) 31 (*)    All other components within normal limits    EKG EKG Interpretation  Date/Time:  Tuesday August 08 2020 10:22:11 EDT Ventricular Rate:  96 PR Interval:    QRS Duration: 84 QT Interval:  350 QTC Calculation: 443 R Axis:   91 Text Interpretation: Sinus rhythm Consider right ventricular hypertrophy Nonspecific T abnormalities, anterior leads No previous ECGs available Confirmed by Vanetta Mulders [phone]) on 08/08/2020 10:25:41 AM Also confirmed by Vanetta Mulders [phone])  on 08/08/2020 10:53:05  AM   Radiology DG Chest Port 1 View  Result Date: 08/08/2020 CLINICAL DATA:  Intermittent chest pain.  COVID positive. EXAM: PORTABLE CHEST 1 VIEW COMPARISON:  11/25/2014.  11/13/2009. FINDINGS: Mediastinum hilar structures appear stable. Stable cardiomegaly. Left base atelectasis/infiltrate cannot be excluded. Left upper and left lower pleuroparenchymal thickening consistent with scarring again noted. No pleural effusion or pneumothorax. IMPRESSION: 1.  Left base atelectasis/infiltrate cannot be excluded. 2. Left upper and left lower pleuroparenchymal thickening consistent with scarring again noted. Electronically Signed   By: Maisie Fus  Register   On: 08/08/2020 11:56    Procedures Procedures (including critical care time)  Medications Ordered in ED Medications -  No data to display  ED Course  I have reviewed the triage vital signs and the nursing notes.  Pertinent labs & imaging results that were available during my care of the patient were reviewed by me and considered in my medical decision making (see chart for details).    MDM Rules/Calculators/A&P                          Chest x-ray may be with some early changes consistent with Covid but nothing significant.  Oxygen saturations here are excellent in the upper 90s.  Patient nontoxic no acute distress.  This is day 3 of symptoms.  Contacted the monoclonal antibody infusion team patient was offered infusion here to say afternoon in their clinic but he would rather get it tomorrow.  Did talk to his wife about this.  Tried to stress that is very important that he received to this.  His best treatment therapy we have once you are are diagnosed.  The treatment team will be contacting the patient to arrange the infusion.  In the meantime I offered multivitamins over-the-counter vitamin D supplements and over-the-counter zinc supplements.  Patient will return for any new or worse symptoms.     Final Clinical Impression(s) / ED  Diagnoses Final diagnoses:  COVID    Rx / DC Orders ED Discharge Orders    None       Vanetta Mulders, MD 08/08/20 1505

## 2020-08-09 ENCOUNTER — Other Ambulatory Visit: Payer: Self-pay | Admitting: Infectious Diseases

## 2020-08-09 ENCOUNTER — Telehealth: Payer: Self-pay | Admitting: Infectious Diseases

## 2020-08-09 DIAGNOSIS — Z8611 Personal history of tuberculosis: Secondary | ICD-10-CM

## 2020-08-09 DIAGNOSIS — U071 COVID-19: Secondary | ICD-10-CM

## 2020-08-09 DIAGNOSIS — J984 Other disorders of lung: Secondary | ICD-10-CM

## 2020-08-09 NOTE — Telephone Encounter (Signed)
Called to Discuss with patient about Covid symptoms and the use of the monoclonal antibody infusion for those with mild to moderate Covid symptoms and at a high risk of hospitalization.     Pt appears to qualify for this infusion due to co-morbid conditions and/or a member of an at-risk group in accordance with the FDA Emergency Use Authorization.    Was treated for TB when he was younger and has some chronic scarring of the lungs.   Sx started on Sunday 10/17.  Qualifies for the infusion due to lung scarring   Rexene Alberts, MSN, NP-C [facility]  [email or web address]

## 2020-08-09 NOTE — Progress Notes (Signed)
I connected by phone with Jerome Hampton on 08/09/2020 at 12:12 PM to discuss the potential use of a new treatment for mild to moderate COVID-19 viral infection in non-hospitalized patients.  This patient is a 64 y.o. male that meets the FDA criteria for Emergency Use Authorization of COVID monoclonal antibody casirivimab/imdevimab or bamlanivimab/eteseviamb.  Has a (+) direct SARS-CoV-2 viral test result  Has mild or moderate COVID-19   Is NOT hospitalized due to COVID-19  Is within 10 days of symptom onset  Has at least one of the high risk factor(s) for progression to severe COVID-19 and/or hospitalization as defined in EUA.  Specific high risk criteria : Chronic Lung Disease   I have spoken and communicated the following to the patient or parent/caregiver regarding COVID monoclonal antibody treatment:  1. FDA has authorized the emergency use for the treatment of mild to moderate COVID-19 in adults and pediatric patients with positive results of direct SARS-CoV-2 viral testing who are 49 years of age and older weighing at least 40 kg, and who are at high risk for progressing to severe COVID-19 and/or hospitalization.  2. The significant known and potential risks and benefits of COVID monoclonal antibody, and the extent to which such potential risks and benefits are unknown.  3. Information on available alternative treatments and the risks and benefits of those alternatives, including clinical trials.  4. Patients treated with COVID monoclonal antibody should continue to self-isolate and use infection control measures (e.g., wear mask, isolate, social distance, avoid sharing personal items, clean and disinfect "high touch" surfaces, and frequent handwashing) according to CDC guidelines.   5. The patient or parent/caregiver has the option to accept or refuse COVID monoclonal antibody treatment.  After reviewing this information with the patient, the patient has agreed to receive one  of the available covid 19 monoclonal antibodies and will be provided an appropriate fact sheet prior to infusion. Rexene Alberts, NP 08/09/2020 12:12 PM

## 2020-08-10 ENCOUNTER — Ambulatory Visit (HOSPITAL_COMMUNITY): Payer: 59 | Attending: Pulmonary Disease

## 2020-08-15 ENCOUNTER — Other Ambulatory Visit: Payer: Self-pay

## 2020-08-15 ENCOUNTER — Emergency Department (HOSPITAL_COMMUNITY)
Admission: EM | Admit: 2020-08-15 | Discharge: 2020-08-15 | Disposition: A | Discharge status: Home / Self Care | Payer: 59 | Attending: Emergency Medicine | Admitting: Emergency Medicine

## 2020-08-15 ENCOUNTER — Encounter (HOSPITAL_COMMUNITY): Payer: Self-pay | Admitting: *Deleted

## 2020-08-15 ENCOUNTER — Other Ambulatory Visit: Payer: Self-pay | Admitting: Nurse Practitioner

## 2020-08-15 ENCOUNTER — Emergency Department (HOSPITAL_COMMUNITY): Payer: 59

## 2020-08-15 ENCOUNTER — Ambulatory Visit (HOSPITAL_COMMUNITY)
Admit: 2020-08-15 | Discharge: 2020-08-15 | Disposition: A | Discharge status: Home / Self Care | Payer: 59 | Attending: Pulmonary Disease | Admitting: Pulmonary Disease

## 2020-08-15 DIAGNOSIS — U071 COVID-19: Secondary | ICD-10-CM | POA: Diagnosis not present

## 2020-08-15 DIAGNOSIS — Z8611 Personal history of tuberculosis: Secondary | ICD-10-CM

## 2020-08-15 DIAGNOSIS — R0600 Dyspnea, unspecified: Secondary | ICD-10-CM | POA: Diagnosis present

## 2020-08-15 DIAGNOSIS — J984 Other disorders of lung: Secondary | ICD-10-CM

## 2020-08-15 MED ORDER — DIPHENHYDRAMINE HCL 50 MG/ML IJ SOLN: 50.0000 mg | Freq: Once | INTRAMUSCULAR | Status: DC | PRN

## 2020-08-15 MED ORDER — METHYLPREDNISOLONE SODIUM SUCC 125 MG IJ SOLR: 125.0000 mg | Freq: Once | INTRAMUSCULAR | Status: DC | PRN

## 2020-08-15 MED ORDER — FAMOTIDINE IN NACL 20-0.9 MG/50ML-% IV SOLN: 20.0000 mg | Freq: Once | INTRAVENOUS | Status: DC | PRN

## 2020-08-15 MED ORDER — ALBUTEROL SULFATE HFA 108 (90 BASE) MCG/ACT IN AERS: 2.0000 | INHALATION_SPRAY | Freq: Once | RESPIRATORY_TRACT | Status: DC | PRN

## 2020-08-15 MED ORDER — SODIUM CHLORIDE 0.9 % IV SOLN: Freq: Once | INTRAVENOUS | Status: AC

## 2020-08-15 MED ORDER — EPINEPHRINE 0.3 MG/0.3ML IJ SOAJ: 0.3000 mg | Freq: Once | INTRAMUSCULAR | Status: DC | PRN

## 2020-08-15 MED ORDER — SODIUM CHLORIDE 0.9 % IV SOLN: INTRAVENOUS | Status: DC | PRN

## 2020-08-15 NOTE — Progress Notes (Addendum)
  Diagnosis: COVID-19  Physician:  Shan Levans  Procedure: Covid Infusion Clinic Med: bamlanivimab\etesevimab infusion - Provided patient with bamlanimivab\etesevimab fact sheet for patients, parents and caregivers prior to infusion.  Complications: No immediate complications noted.  Discharge: Discharged home   [name], [place] 08/15/2020

## 2020-08-15 NOTE — ED Provider Notes (Signed)
[facility]-EMERGENCY DEPT Provider Note   CSN: [id number] Arrival date & time: 08/15/20  0848     History Chief Complaint  Patient presents with  . Covid Positive  . Chills    Jerome Hampton is a 64 y.o. male.  HPI 63 year old male presents with Covid symptoms.  He states he was diagnosed on 10/18 and his symptoms started a day or so before.  The patient is currently complaining of mild dyspnea as well as chills and sweats when he wakes up.  He states these have been continuous since he left the ER last week.  No fever, cough, or chest pain.  No vomiting, diarrhea or lightheadedness.  He states he otherwise feels well but wants to get reassurance. He was offered the MAB but did not get it.  Past Medical History:  Diagnosis Date  . TB (pulmonary tuberculosis)    Pt reports having active TB when he was younger    Patient Active Problem List   Diagnosis Date Noted  . TB (pulmonary tuberculosis)     History reviewed. No pertinent surgical history.     Family History  Problem Relation Age of Onset  . Hypertension Mother     Social History   Tobacco Use  . Smoking status: Never Smoker  . Smokeless tobacco: Never Used  Vaping Use  . Vaping Use: Never used  Substance Use Topics  . Alcohol use: No  . Drug use: No    Home Medications Prior to Admission medications   Medication Sig Start Date End Date Taking? Authorizing Provider  acetaminophen (TYLENOL) 325 MG tablet Take 650 mg by mouth every 6 (six) hours as needed for mild pain, fever or headache.    [provider]  albuterol (PROVENTIL HFA;VENTOLIN HFA) 108 (90 BASE) MCG/ACT inhaler Inhale 2 puffs into the lungs every 4 (four) hours as needed for wheezing or shortness of breath. Patient not taking: Reported on 08/08/2020 11/25/14   Jaynie Crumble, PA-C  benzonatate (TESSALON) 100 MG capsule Take 1 capsule (100 mg total) by mouth every 8 (eight) hours. Patient not taking:  Reported on 08/08/2020 11/25/14   Jaynie Crumble, PA-C  naproxen (NAPROSYN) 500 MG tablet Take 1 tablet (500 mg total) by mouth 2 (two) times daily. Patient not taking: Reported on 08/08/2020 11/25/14   Jaynie Crumble, PA-C    Allergies    Patient has no known allergies.  Review of Systems   Review of Systems  Constitutional: Positive for chills and diaphoresis. Negative for fever.  Respiratory: Positive for shortness of breath. Negative for cough.   Cardiovascular: Negative for chest pain.  Gastrointestinal: Negative for abdominal pain, diarrhea and vomiting.  Neurological: Negative for dizziness and light-headedness.  All other systems reviewed and are negative.   Physical Exam Updated Vital Signs BP 123/78   Pulse 71   Temp 98.6 F (37 C) (Oral)   Resp 18   Ht 5\' 11"  (1.803 m)   Wt 59 kg   SpO2 100%   BMI 18.13 kg/m   Physical Exam Vitals and nursing note reviewed.  Constitutional:      General: He is not in acute distress.    Appearance: He is well-developed. He is not ill-appearing or diaphoretic.  HENT:     Head: Normocephalic and atraumatic.     Right Ear: External ear normal.     Left Ear: External ear normal.     Nose: Nose normal.  Eyes:     General:  Right eye: No discharge.        Left eye: No discharge.  Cardiovascular:     Rate and Rhythm: Normal rate and regular rhythm.     Heart sounds: Normal heart sounds.  Pulmonary:     Effort: Pulmonary effort is normal. No tachypnea, accessory muscle usage or respiratory distress.     Breath sounds: Normal breath sounds.  Abdominal:     Palpations: Abdomen is soft.     Tenderness: There is no abdominal tenderness.  Musculoskeletal:     Cervical back: Neck supple.  Skin:    General: Skin is warm and dry.  Neurological:     Mental Status: He is alert.  Psychiatric:        Mood and Affect: Mood is not anxious.     ED Results / Procedures / Treatments   Labs (all labs ordered are listed,  but only abnormal results are displayed) Labs Reviewed - No data to display  EKG None  Radiology DG Chest Portable 1 View  Result Date: 08/15/2020 CLINICAL DATA:  COVID-19 positive.  Shortness of breath EXAM: PORTABLE CHEST 1 VIEW COMPARISON:  August 08, 2020 and November 25, 2014 FINDINGS: There is stable scarring with volume loss in the left upper lobe toward the apex. There is chronic blunting of the left costophrenic angle, likely due to scarring. Lungs elsewhere are clear. Heart size is within normal limits. There is distortion of pulmonary vascularity on the left superiorly. Pulmonary vascularity overall is stable. No adenopathy evident. No bone lesions. IMPRESSION: Areas of apparent scarring with volume loss on the left, stable. No edema or airspace opacity. Stable cardiac silhouette. No adenopathy evident by radiography. Electronically Signed   By: Bretta Bang III M.D.   On: 08/15/2020 09:50    Procedures Procedures (including critical care time)  Medications Ordered in ED Medications - No data to display  ED Course  I have reviewed the triage vital signs and the nursing notes.  Pertinent labs & imaging results that were available during my care of the patient were reviewed by me and considered in my medical decision making (see chart for details).    MDM Rules/Calculators/A&P                          Patient is well-appearing.  He seems concerned mostly that his symptoms have not gone away but this appears pretty typical of Covid-19.  While he reports some mild dyspnea, he is not having any increased work of breathing and his oxygen is 100%.  Chest x-ray personally viewed and is benign.  I do not see any indication for lab work.  There is no chest pain.  At this point, he appears stable for discharge.  I did discuss with the infusion clinic and they would like for him to come over after discharge for monoclonal antibody infusion as today is day 10.  Jerome Hampton was  evaluated in Emergency Department on 08/15/2020 for the symptoms described in the history of present illness. He was evaluated in the context of the global COVID-19 pandemic, which necessitated consideration that the patient might be at risk for infection with the SARS-CoV-2 virus that causes COVID-19. Institutional protocols and algorithms that pertain to the evaluation of patients at risk for COVID-19 are in a state of rapid change based on information released by regulatory bodies including the CDC and federal and state organizations. These policies and algorithms were followed during the patient's  care in the ED.  Final Clinical Impression(s) / ED Diagnoses Final diagnoses:  COVID-19    Rx / DC Orders ED Discharge Orders    None       Pricilla Loveless, MD 08/15/20 1011

## 2020-08-15 NOTE — ED Triage Notes (Signed)
Pt states he is having cold sweats and chills at night. Dx Covid 08/06/20. States some SHOB none noted during triage

## 2020-08-15 NOTE — Discharge Instructions (Signed)
Go straight to the Monoclonal Antibody Infusion Clinic

## 2020-08-15 NOTE — Discharge Instructions (Signed)

## 2020-08-15 NOTE — Progress Notes (Signed)
I connected by phone with Jerome Hampton on 08/15/2020 at 10:07 AM to discuss the potential use of an new treatment for mild to moderate COVID-19 viral infection in non-hospitalized patients.  This patient is a 63 y.o. male that meets the FDA criteria for Emergency Use Authorization of bamlanivimab/etesevimab or casirivimab\imdevimab.  Has a (+) direct SARS-CoV-2 viral test result  Has mild or moderate COVID-19   Is ? 63 years of age and weighs ? 40 kg  Is NOT hospitalized due to COVID-19  Is NOT requiring oxygen therapy or requiring an increase in baseline oxygen flow rate due to COVID-19  Is within 10 days of symptom onset  Has at least one of the high risk factor(s) for progression to severe COVID-19 and/or hospitalization as defined in EUA.  Specific high risk criteria : Chronic Lung Disease    I have spoken and communicated the following to the patient or parent/caregiver:  1. FDA has authorized the emergency use of bamlanivimab/etesevimab and casirivimab\imdevimab for the treatment of mild to moderate COVID-19 in adults and pediatric patients with positive results of direct SARS-CoV-2 viral testing who are 63 years of age and older weighing at least 40 kg, and who are at high risk for progressing to severe COVID-19 and/or hospitalization.  2. The significant known and potential risks and benefits of bamlanivimab/etesevimab and casirivimab\imdevimab, and the extent to which such potential risks and benefits are unknown.  3. Information on available alternative treatments and the risks and benefits of those alternatives, including clinical trials.  4. Patients treated with bamlanivimab/etesevimab and casirivimab\imdevimab should continue to self-isolate and use infection control measures (e.g., wear mask, isolate, social distance, avoid sharing personal items, clean and disinfect "high touch" surfaces, and frequent handwashing) according to CDC guidelines.   5. The patient or  parent/caregiver has the option to accept or refuse bamlanivimab/etesevimab or casirivimab\imdevimab .  After reviewing this information with the patient, the patient has agreed to receive one of the available covid 19 monoclonal antibodies and will be provided an appropriate fact sheet prior to infusion.Consuello Masse, DNP, AGNP-C [phone] (Infusion Center Hotline)

## 2021-09-01 ENCOUNTER — Emergency Department (HOSPITAL_COMMUNITY)
Admission: EM | Admit: 2021-09-01 | Discharge: 2021-09-01 | Disposition: A | Discharge status: Home / Self Care | Payer: PRIVATE HEALTH INSURANCE | Attending: Emergency Medicine | Admitting: Emergency Medicine

## 2021-09-01 ENCOUNTER — Encounter (HOSPITAL_COMMUNITY): Payer: Self-pay

## 2021-09-01 DIAGNOSIS — M25512 Pain in left shoulder: Secondary | ICD-10-CM | POA: Diagnosis present

## 2021-09-01 MED ORDER — NAPROXEN 500 MG PO TABS: 500.0000 mg | ORAL_TABLET | Freq: Two times a day (BID) | ORAL | 0 refills | Status: AC

## 2021-09-01 MED ORDER — KETOROLAC TROMETHAMINE 60 MG/2ML IM SOLN: 30.0000 mg | Freq: Once | INTRAMUSCULAR | Status: DC

## 2021-09-01 NOTE — ED Triage Notes (Signed)
C/o left shoulder pain since Tuesday after he did some yard work on Monday.

## 2021-09-01 NOTE — Discharge Instructions (Addendum)
You were seen here today for left shoulder pain. This is likely muscular pain from your yardwork. You have been prescribed Naprosyn to take as needed for pain. If you have any chest pain, SOB, numbness, tingling, or weakness, please return to the ER for evaluation.

## 2021-09-01 NOTE — ED Provider Notes (Signed)
[facility] DEPT Provider Note   CSN: [id number] Arrival date & time: 09/01/21  0908     History Chief Complaint  Patient presents with   Shoulder Pain    Jerome Hampton is a 65 y.o. male presents the emergency department for 5 days of left upper shoulder pain after blowing leaves in his yard all day.  Patient reports he tried 1000 mg of Tylenol yesterday without much relief.  Exacerbated by movement.  Relieved with rest.  Patient denies any chest pain, shortness of breath, numbness, tingling, back pain, or neck pain.  Requesting "pain meds".  Denies any medical history.  Denies any surgical history.  No daily medications.  No known drug allergies.  Denies any tobacco, EtOH, or drug use.   Shoulder Pain Associated symptoms: no back pain and no fever       Past Medical History:  Diagnosis Date   TB (pulmonary tuberculosis)    Pt reports having active TB when he was younger    Patient Active Problem List   Diagnosis Date Noted   TB (pulmonary tuberculosis)     History reviewed. No pertinent surgical history.     Family History  Problem Relation Age of Onset   Hypertension Mother     Social History   Tobacco Use   Smoking status: Never   Smokeless tobacco: Never  Vaping Use   Vaping Use: Never used  Substance Use Topics   Alcohol use: No   Drug use: No    Home Medications Prior to Admission medications   Medication Sig Start Date End Date Taking? Authorizing Provider  naproxen (NAPROSYN) 500 MG tablet Take 1 tablet (500 mg total) by mouth 2 (two) times daily. 09/01/21  Yes Sherrell Puller, PA-C  acetaminophen (TYLENOL) 325 MG tablet Take 650 mg by mouth every 6 (six) hours as needed for mild pain, fever or headache.    [provider]  albuterol (PROVENTIL HFA;VENTOLIN HFA) 108 (90 BASE) MCG/ACT inhaler Inhale 2 puffs into the lungs every 4 (four) hours as needed for wheezing or shortness of breath. Patient not taking:  Reported on 08/08/2020 11/25/14   Jeannett Senior, PA-C  benzonatate (TESSALON) 100 MG capsule Take 1 capsule (100 mg total) by mouth every 8 (eight) hours. Patient not taking: Reported on 08/08/2020 11/25/14   Jeannett Senior, PA-C    Allergies    Patient has no known allergies.  Review of Systems   Review of Systems  Constitutional:  Negative for chills and fever.  HENT:  Negative for ear pain and sore throat.   Eyes:  Negative for pain and visual disturbance.  Respiratory:  Negative for cough and shortness of breath.   Cardiovascular:  Negative for chest pain and palpitations.  Gastrointestinal:  Negative for abdominal pain and vomiting.  Genitourinary:  Negative for dysuria and hematuria.  Musculoskeletal:  Negative for arthralgias and back pain.  Skin:  Negative for color change and rash.  Neurological:  Negative for seizures and syncope.  All other systems reviewed and are negative.  Physical Exam Updated Vital Signs BP (!) 136/94 (BP Location: Right Arm)   Pulse 96   Temp 98.3 F (36.8 C) (Oral)   Resp 18   SpO2 100%   Physical Exam Vitals and nursing note reviewed.  Constitutional:      General: He is not in acute distress.    Appearance: Normal appearance. He is well-developed. He is not toxic-appearing.  HENT:     Head: Normocephalic  and atraumatic.  Eyes:     Conjunctiva/sclera: Conjunctivae normal.  Cardiovascular:     Rate and Rhythm: Normal rate and regular rhythm.     Pulses: Normal pulses.     Heart sounds: No murmur heard. Pulmonary:     Effort: Pulmonary effort is normal. No respiratory distress.     Breath sounds: Normal breath sounds.  Abdominal:     Palpations: Abdomen is soft.     Tenderness: There is no abdominal tenderness.  Musculoskeletal:        General: Tenderness present. No swelling, deformity or signs of injury.     Cervical back: Normal range of motion and neck supple. No tenderness.     Comments: Patient has a palpable muscle  spasm superior to his left scapula.  No overlying skin changes noted no ecchymosis, rashes, or erythema.  No obvious deformities or step-offs.  Patient has full range of motion of his shoulder, elbow, and wrist.  Denies any numbness or tingling.  Sensation intact.  Compartments soft.  Radial pulses intact.  Cap refill less than 2 seconds.  Skin:    General: Skin is warm and dry.     Findings: No rash.  Neurological:     Mental Status: He is alert.    ED Results / Procedures / Treatments   Labs (all labs ordered are listed, but only abnormal results are displayed) Labs Reviewed - No data to display  EKG None  Radiology No results found.  Procedures Procedures   Medications Ordered in ED Medications - No data to display  ED Course  I have reviewed the triage vital signs and the nursing notes.  Pertinent labs & imaging results that were available during my care of the patient were reviewed by me and considered in my medical decision making (see chart for details).  65 year old male presenting with left shoulder pain for the past 5 days.  Denies any injury mechanism.  Denies any falls, traumas, MVC's.  Patient reports he was just blowing leaves all day.  No x-ray warranted at this time given no traumatic injury.  Patient full range of motion.  Compartments soft.  Cap refill less than 2 seconds.  Radial pulses 2+.  Pain is likely muscular in nature.  Offered patient Toradol which she declined and reported he only wanted "pain pills".  I prescribed the patient naproxen to take twice daily as needed for pain.  Strict return precautions given.  Patient agrees with plan.  Patient is stable and being discharged home in good condition.  MDM Rules/Calculators/A&P                          Final Clinical Impression(s) / ED Diagnoses Final diagnoses:  Acute pain of left shoulder    Rx / DC Orders ED Discharge Orders          Ordered    naproxen (NAPROSYN) 500 MG tablet  2 times daily         09/01/21 0947             Achille Rich, PA-C 09/02/21 1708    Gwyneth Sprout, MD 09/03/21 [phone]

## 2021-09-01 NOTE — ED Provider Notes (Signed)
Emergency Medicine Provider Triage Evaluation Note  Jerome Hampton , a 65 y.o. male  was evaluated in triage.  Pt complains of left upper shoulder pain for the past 5 days after blowing leaves all day. Denies any chest pain, SOB, numbness, tingling. Denies and fall or recent surgeries  Review of Systems  Positive: Left shoulder pain Negative: chest pain, SOB, numbness, tingling  Physical Exam  BP (!) 136/94 (BP Location: Right Arm)   Pulse 96   Resp 18   SpO2 100%  Gen:   Awake, no distress, sitting comfortably Resp:  Normal effort  MSK:   Moves extremities without difficulty, FROM, radial pulse 2+, compartments soft, sensation intact Other:    Medical Decision Making  Medically screening exam initiated at 9:19 AM.  Appropriate orders placed.  Jerome Hampton was informed that the remainder of the evaluation will be completed by another provider, this initial triage assessment does not replace that evaluation, and the importance of remaining in the ED until their evaluation is complete.  Pain control   Achille Rich, PA-C 09/01/21 1937    Gwyneth Sprout, MD 09/03/21 [phone]

## 2022-02-06 ENCOUNTER — Other Ambulatory Visit: Payer: Self-pay

## 2022-02-06 ENCOUNTER — Encounter (HOSPITAL_COMMUNITY): Payer: Self-pay

## 2022-02-06 ENCOUNTER — Emergency Department (HOSPITAL_COMMUNITY)
Admission: EM | Admit: 2022-02-06 | Discharge: 2022-02-06 | Disposition: A | Discharge status: Home / Self Care | Payer: Medicare HMO | Attending: Emergency Medicine | Admitting: Emergency Medicine

## 2022-02-06 DIAGNOSIS — R1110 Vomiting, unspecified: Secondary | ICD-10-CM | POA: Insufficient documentation

## 2022-02-06 DIAGNOSIS — R1033 Periumbilical pain: Secondary | ICD-10-CM | POA: Insufficient documentation

## 2022-02-06 DIAGNOSIS — D649 Anemia, unspecified: Secondary | ICD-10-CM | POA: Insufficient documentation

## 2022-02-06 LAB — COMPREHENSIVE METABOLIC PANEL
ALT: 23 U/L (ref 0–44)
AST: 28 U/L (ref 15–41)
Albumin: 4.9 g/dL (ref 3.5–5.0)
Alkaline Phosphatase: 85 U/L (ref 38–126)
Anion gap: 8 (ref 5–15)
BUN: 22 mg/dL (ref 8–23)
CO2: 28 mmol/L (ref 22–32)
Calcium: 9.9 mg/dL (ref 8.9–10.3)
Chloride: 106 mmol/L (ref 98–111)
Creatinine, Ser: 1.33 mg/dL — ABNORMAL HIGH (ref 0.61–1.24)
GFR, Estimated: 59 mL/min — ABNORMAL LOW (ref >60)
Glucose, Bld: 105 mg/dL — ABNORMAL HIGH (ref 70–99)
Potassium: 3.7 mmol/L (ref 3.5–5.1)
Sodium: 142 mmol/L (ref 135–145)
Total Bilirubin: 1.6 mg/dL — ABNORMAL HIGH (ref 0.3–1.2)
Total Protein: 8.3 g/dL — ABNORMAL HIGH (ref 6.5–8.1)

## 2022-02-06 LAB — CBC
HCT: 35.7 % — ABNORMAL LOW (ref 39.0–52.0)
Hemoglobin: 11.4 g/dL — ABNORMAL LOW (ref 13.0–17.0)
MCH: 24.2 pg — ABNORMAL LOW (ref 26.0–34.0)
MCHC: 31.9 g/dL (ref 30.0–36.0)
MCV: 75.8 fL — ABNORMAL LOW (ref 80.0–100.0)
Platelets: 205 10*3/uL (ref 150–400)
RBC: 4.71 MIL/uL (ref 4.22–5.81)
RDW: 15.4 % (ref 11.5–15.5)
WBC: 11.9 10*3/uL — ABNORMAL HIGH (ref 4.0–10.5)
nRBC: 0 % (ref 0.0–0.2)

## 2022-02-06 LAB — LIPASE, BLOOD: Lipase: 25 U/L (ref 11–51)

## 2022-02-06 MED ORDER — DICYCLOMINE HCL 10 MG PO CAPS
10.0000 mg | ORAL_CAPSULE | Freq: Once | ORAL | Status: AC
  Administered 2022-02-06: 10 mg via ORAL
  Filled 2022-02-06: qty 1

## 2022-02-06 NOTE — ED Notes (Signed)
Discharge instructions reviewed, questions answered. Rx education provided - pt told he may also take tylenol at home for pain. Reviewed s/s to return to ED for. Pt states understanding and no further questions. Pt ambulatory with steady gait upon discharge. No s/s of distress noted. ?

## 2022-02-06 NOTE — ED Triage Notes (Signed)
Pt reports tonight after eating cereal and grapes he developed sharp intermittent epigastric pain. Endorses 4 episodes of emesis  ?

## 2022-02-06 NOTE — ED Provider Notes (Signed)
?[facility] DEPT ?Provider Note ? ? ?CSN: [id number] ?Arrival date & time: 02/06/22  2119 ? ?  ? ?History ? ?Chief Complaint  ?Patient presents with  ? Abdominal Pain  ? ? ?Jerome Hampton is a 66 y.o. male who presents with concern for central abdominal pain that is intermittent starting this afternoon.  Patient states he ate a meal of cereal and grapes around 3:00 this afternoon between 4 and 5 began to experience central abdominal pain.  Nurse documentation from triage states patient reports 4 episodes of emesis, however he denies any nausea or vomiting to this provider during my interview.  He states that he has been passing gas since onset of his belly pain and had a normal bowel movement today around 3 PM.  Denies any change in his urinary habits, hematuria, melena, or hematochezia.  Denies any fevers or chills. ? ?Pain is periumbilical, intermittent, sharp pain that lasts 10 minutes and then recurs 20 minutes later.  ? ?I personally reviewed this patient's medical records.  He does not follow routinely with a primary care doctor.  He has history of being treated for pulmonary tuberculosis many years ago.  He denies any cough or shortness of breath at this time.  Denies any weight loss. ? ?HPI ? ?  ? ?Home Medications ?Prior to Admission medications   ?Medication Sig Start Date End Date Taking? Authorizing Provider  ?acetaminophen (TYLENOL) 325 MG tablet Take 650 mg by mouth every 6 (six) hours as needed for mild pain, fever or headache.    [provider]  ?albuterol (PROVENTIL HFA;VENTOLIN HFA) 108 (90 BASE) MCG/ACT inhaler Inhale 2 puffs into the lungs every 4 (four) hours as needed for wheezing or shortness of breath. ?Patient not taking: Reported on 08/08/2020 11/25/14   Jeannett Senior, PA-C  ?benzonatate (TESSALON) 100 MG capsule Take 1 capsule (100 mg total) by mouth every 8 (eight) hours. ?Patient not taking: Reported on 08/08/2020 11/25/14   Jeannett Senior, PA-C  ?naproxen (NAPROSYN) 500 MG tablet Take 1 tablet (500 mg total) by mouth 2 (two) times daily. 09/01/21   Sherrell Puller, PA-C  ?   ? ?Allergies    ?Patient has no known allergies.   ? ?Review of Systems   ?Review of Systems  ?Gastrointestinal:  Positive for abdominal pain.  ?All other systems reviewed and are negative. ? ?Physical Exam ?Updated Vital Signs ?BP 129/70   Pulse 82   Temp 98.2 ?F (36.8 ?C) (Oral)   Resp 18   Ht 5\' 11"  (1.803 m)   Wt 63.5 kg   SpO2 100%   BMI 19.53 kg/m?  ?Physical Exam ?Vitals and nursing note reviewed.  ?Constitutional:   ?   Appearance: He is not ill-appearing or toxic-appearing.  ?HENT:  ?   Head: Normocephalic and atraumatic.  ?   Nose: Nose normal.  ?   Mouth/Throat:  ?   Mouth: Mucous membranes are moist.  ?   Pharynx: Oropharynx is clear. Uvula midline. No oropharyngeal exudate or posterior oropharyngeal erythema.  ?   Tonsils: No tonsillar exudate.  ?Eyes:  ?   General: Lids are normal. Vision grossly intact.     ?   Right eye: No discharge.     ?   Left eye: No discharge.  ?   Conjunctiva/sclera: Conjunctivae normal.  ?   Pupils: Pupils are equal, round, and reactive to light.  ?Neck:  ?   Trachea: Trachea and phonation normal.  ?Cardiovascular:  ?  Rate and Rhythm: Normal rate and regular rhythm.  ?   Pulses: Normal pulses.  ?   Heart sounds: Normal heart sounds.  ?Pulmonary:  ?   Effort: Pulmonary effort is normal. No tachypnea, bradypnea, accessory muscle usage, prolonged expiration or respiratory distress.  ?   Breath sounds: Normal breath sounds. No wheezing or rales.  ?Chest:  ?   Chest wall: No mass, lacerations, deformity, swelling, tenderness, crepitus or edema.  ?Abdominal:  ?   General: Bowel sounds are normal. There is no distension.  ?   Palpations: Abdomen is soft.  ?   Tenderness: There is abdominal tenderness in the periumbilical area. There is no right CVA tenderness, left CVA tenderness, guarding or rebound. Negative signs include  Murphy's sign, Rovsing's sign and McBurney's sign.  ?Musculoskeletal:     ?   General: No deformity.  ?   Cervical back: Normal range of motion and neck supple.  ?   Right lower leg: No edema.  ?   Left lower leg: No edema.  ?Lymphadenopathy:  ?   Cervical: No cervical adenopathy.  ?Skin: ?   General: Skin is warm and dry.  ?   Capillary Refill: Capillary refill takes less than 2 seconds.  ?Neurological:  ?   General: No focal deficit present.  ?   Mental Status: He is alert and oriented to person, place, and time. Mental status is at baseline.  ?   GCS: GCS eye subscore is 4. GCS verbal subscore is 5. GCS motor subscore is 6.  ?   Gait: Gait is intact.  ?Psychiatric:     ?   Mood and Affect: Mood normal.  ? ? ?ED Results / Procedures / Treatments   ?Labs ?(all labs ordered are listed, but only abnormal results are displayed) ?Labs Reviewed  ?COMPREHENSIVE METABOLIC PANEL - Abnormal; Notable for the following components:  ?    Result Value  ? Glucose, Bld 105 (*)   ? Creatinine, Ser 1.33 (*)   ? Total Protein 8.3 (*)   ? Total Bilirubin 1.6 (*)   ? GFR, Estimated 59 (*)   ? All other components within normal limits  ?CBC - Abnormal; Notable for the following components:  ? WBC 11.9 (*)   ? Hemoglobin 11.4 (*)   ? HCT 35.7 (*)   ? MCV 75.8 (*)   ? MCH 24.2 (*)   ? All other components within normal limits  ?LIPASE, BLOOD  ?URINALYSIS, ROUTINE W REFLEX MICROSCOPIC  ? ? ?EKG ?None ? ?Radiology ?No results found. ? ?Procedures ?Procedures  ? ? ?Medications Ordered in ED ?Medications  ?dicyclomine (BENTYL) capsule 10 mg (has no administration in time range)  ? ? ?ED Course/ Medical Decision Making/ A&P ?  ?                        ?Medical Decision Making ?66 year old male who presents with concern for central abdominal pain onset around 4 PM this evening. ? ? ?Vital signs are normal and intake.  Cardiopulmonary is normal abdominal exams as above with periumbilical tenderness to palpation without rebound or guarding.  No  skin changes.  No McBurney's point tenderness or Murphy's time.  No lower extremity edema. ? ?Amount and/or Complexity of Data Reviewed ?Labs: ordered. ?   Details: CBC with mild cyst of 11.9.  Mild anemia with hemoglobin of 11.4 near patient's baseline.  CMP with creatinine at patient's baseline of 1.3, total bilirubin mildly elevated  to 1.6.  Patient refused urine sample. ? ?Risk ?Prescription drug management. ? ? ?Given periumbilical pain and patient's vague history, CT scan of the abdomen pelvis was recommended, however patient refused this preferring to be discharged home with strict return precautions should his condition worsen.  I feel this is reasonable given normal vital signs and overall nonemergent work-up. ? ?No further work-up warranted near this time.  Strict return precautions were given. Celia voiced understanding of his medical evaluation and treatment plan. Each of their questions answered to their expressed satisfaction.  Return precautions were given.  Patient is well-appearing, stable, and was discharged in good condition. ? ?This chart was dictated using voice recognition software, Dragon. Despite the best efforts of this provider to proofread and correct errors, errors may still occur which can change documentation meaning. ? ?Final Clinical Impression(s) / ED Diagnoses ?Final diagnoses:  ?None  ? ? ?Rx / DC Orders ?ED Discharge Orders   ? ? None  ? ?  ? ? ?  ?Emeline Darling, PA-C ?02/06/22 2304 ? ?  ?Drenda Freeze, MD ?02/06/22 2314 ? ?

## 2022-02-06 NOTE — Discharge Instructions (Signed)
Please return to the ER if you develop any worsening abdominal pain, nausea or vomiting that does not stop, if you have normal you passing gas, redevelop any fevers or chills or any other new severe symptom. ?

## 2023-02-14 ENCOUNTER — Emergency Department (HOSPITAL_COMMUNITY)
Admission: EM | Admit: 2023-02-14 | Discharge: 2023-02-14 | Discharge status: Against Medical Advice | Payer: Medicare Other | Attending: Emergency Medicine | Admitting: Emergency Medicine

## 2023-02-14 ENCOUNTER — Emergency Department (HOSPITAL_COMMUNITY): Payer: Medicare Other

## 2023-02-14 ENCOUNTER — Encounter (HOSPITAL_COMMUNITY): Payer: Self-pay | Admitting: *Deleted

## 2023-02-14 ENCOUNTER — Other Ambulatory Visit: Payer: Self-pay

## 2023-02-14 DIAGNOSIS — R1013 Epigastric pain: Secondary | ICD-10-CM | POA: Insufficient documentation

## 2023-02-14 DIAGNOSIS — Z5329 Procedure and treatment not carried out because of patient's decision for other reasons: Secondary | ICD-10-CM | POA: Diagnosis not present

## 2023-02-14 DIAGNOSIS — R112 Nausea with vomiting, unspecified: Secondary | ICD-10-CM | POA: Diagnosis not present

## 2023-02-14 LAB — COMPREHENSIVE METABOLIC PANEL
ALT: 24 U/L (ref 0–44)
AST: 32 U/L (ref 15–41)
Albumin: 4.6 g/dL (ref 3.5–5.0)
Alkaline Phosphatase: 82 U/L (ref 38–126)
Anion gap: 13 (ref 5–15)
BUN: 23 mg/dL (ref 8–23)
CO2: 24 mmol/L (ref 22–32)
Calcium: 9.4 mg/dL (ref 8.9–10.3)
Chloride: 101 mmol/L (ref 98–111)
Creatinine, Ser: 1.19 mg/dL (ref 0.61–1.24)
GFR, Estimated: >60 mL/min (ref >60)
Glucose, Bld: 130 mg/dL — ABNORMAL HIGH (ref 70–99)
Potassium: 4 mmol/L (ref 3.5–5.1)
Sodium: 138 mmol/L (ref 135–145)
Total Bilirubin: 1.5 mg/dL — ABNORMAL HIGH (ref 0.3–1.2)
Total Protein: 8 g/dL (ref 6.5–8.1)

## 2023-02-14 LAB — CBC WITH DIFFERENTIAL/PLATELET
Abs Immature Granulocytes: 0.03 10*3/uL (ref 0.00–0.07)
Basophils Absolute: 0 10*3/uL (ref 0.0–0.1)
Basophils Relative: 0 %
Eosinophils Absolute: 0 10*3/uL (ref 0.0–0.5)
Eosinophils Relative: 0 %
HCT: 36.4 % — ABNORMAL LOW (ref 39.0–52.0)
Hemoglobin: 11.5 g/dL — ABNORMAL LOW (ref 13.0–17.0)
Immature Granulocytes: 0 %
Lymphocytes Relative: 7 %
Lymphs Abs: 0.7 10*3/uL (ref 0.7–4.0)
MCH: 23.4 pg — ABNORMAL LOW (ref 26.0–34.0)
MCHC: 31.6 g/dL (ref 30.0–36.0)
MCV: 74.1 fL — ABNORMAL LOW (ref 80.0–100.0)
Monocytes Absolute: 0.5 10*3/uL (ref 0.1–1.0)
Monocytes Relative: 5 %
Neutro Abs: 9 10*3/uL — ABNORMAL HIGH (ref 1.7–7.7)
Neutrophils Relative %: 88 %
Platelets: 188 10*3/uL (ref 150–400)
RBC: 4.91 MIL/uL (ref 4.22–5.81)
RDW: 14.6 % (ref 11.5–15.5)
WBC: 10.2 10*3/uL (ref 4.0–10.5)
nRBC: 0 % (ref 0.0–0.2)

## 2023-02-14 LAB — URINALYSIS, ROUTINE W REFLEX MICROSCOPIC
Bilirubin Urine: NEGATIVE
Glucose, UA: NEGATIVE mg/dL
Hgb urine dipstick: NEGATIVE
Ketones, ur: NEGATIVE mg/dL
Leukocytes,Ua: NEGATIVE
Nitrite: NEGATIVE
Protein, ur: NEGATIVE mg/dL
Specific Gravity, Urine: 1.019 (ref 1.005–1.030)
pH: 5 (ref 5.0–8.0)

## 2023-02-14 LAB — LIPASE, BLOOD: Lipase: 28 U/L (ref 11–51)

## 2023-02-14 MED ORDER — ONDANSETRON HCL 4 MG/2ML IJ SOLN
4.0000 mg | Freq: Once | INTRAMUSCULAR | Status: AC
  Administered 2023-02-14: 4 mg via INTRAVENOUS
  Filled 2023-02-14: qty 2

## 2023-02-14 MED ORDER — IOHEXOL 300 MG/ML  SOLN: 100.0000 mL | Freq: Once | INTRAMUSCULAR | Status: DC | PRN

## 2023-02-14 MED ORDER — SODIUM CHLORIDE (PF) 0.9 % IJ SOLN
INTRAMUSCULAR | Status: AC
  Filled 2023-02-14: qty 50

## 2023-02-14 MED ORDER — MORPHINE SULFATE (PF) 4 MG/ML IV SOLN
4.0000 mg | Freq: Once | INTRAVENOUS | Status: AC
  Administered 2023-02-14: 4 mg via INTRAVENOUS
  Filled 2023-02-14: qty 1

## 2023-02-14 MED ORDER — SODIUM CHLORIDE 0.9 % IV BOLUS
1000.0000 mL | Freq: Once | INTRAVENOUS | Status: AC
  Administered 2023-02-14: 1000 mL via INTRAVENOUS

## 2023-02-14 NOTE — ED Triage Notes (Signed)
C/o upper abd pain with emesis x3 that woke him from sleep this am.  Denies diarrhea Pt reports eating chinese food yesterday evening.

## 2023-02-14 NOTE — ED Provider Notes (Signed)
[place] EMERGENCY DEPARTMENT AT [facility] Provider Note   CSN: [id number] Arrival date & time: 02/14/23  3710     History  Chief Complaint  Patient presents with   Abdominal Pain   Emesis    Jerome Hampton is a 67 y.o. male.  The history is provided by the patient and medical records. No language interpreter was used.  Abdominal Pain Associated symptoms: vomiting   Emesis Associated symptoms: abdominal pain      67 year old male prior history of TB, presenting here with complaint of abdominal pain.  Patient endorses cramping epigastric pain with associate nausea and vomiting that started early this morning.  Normal bowel movement.  States that he ate [place] food last night.  States that he brought home but it was sent out for little bit and that he repeat the food before eating.  Does not endorse any fever or chills no chest pain or shortness of breath no cough no urinary symptoms.  States pain is mild at this time.  No recent medication change.  Denies alcohol or tobacco use.  Home Medications Prior to Admission medications   Medication Sig Start Date End Date Taking? Authorizing Provider  acetaminophen (TYLENOL) 325 MG tablet Take 650 mg by mouth every 6 (six) hours as needed for mild pain, fever or headache.    [provider]  albuterol (PROVENTIL HFA;VENTOLIN HFA) 108 (90 BASE) MCG/ACT inhaler Inhale 2 puffs into the lungs every 4 (four) hours as needed for wheezing or shortness of breath. Patient not taking: Reported on 08/08/2020 11/25/14   Jaynie Crumble, PA-C  benzonatate (TESSALON) 100 MG capsule Take 1 capsule (100 mg total) by mouth every 8 (eight) hours. Patient not taking: Reported on 08/08/2020 11/25/14   Jaynie Crumble, PA-C  naproxen (NAPROSYN) 500 MG tablet Take 1 tablet (500 mg total) by mouth 2 (two) times daily. 09/01/21   Achille Rich, PA-C      Allergies    Patient has no known allergies.    Review of Systems    Review of Systems  Gastrointestinal:  Positive for abdominal pain and vomiting.  All other systems reviewed and are negative.   Physical Exam Updated Vital Signs BP 125/75 (BP Location: Right Wrist)   Pulse 77   Temp 97.9 F (36.6 C) (Oral)   Resp 18   Wt 63 kg   SpO2 100%   BMI 19.37 kg/m  Physical Exam Vitals and nursing note reviewed.  Constitutional:      General: He is not in acute distress.    Appearance: He is well-developed.  HENT:     Head: Atraumatic.  Eyes:     Conjunctiva/sclera: Conjunctivae normal.  Cardiovascular:     Rate and Rhythm: Normal rate and regular rhythm.     Heart sounds: Normal heart sounds.  Pulmonary:     Effort: Pulmonary effort is normal.     Breath sounds: Normal breath sounds.  Abdominal:     General: Abdomen is flat.     Palpations: Abdomen is soft.     Tenderness: There is abdominal tenderness in the epigastric area.     Hernia: No hernia is present.  Musculoskeletal:     Cervical back: Neck supple.  Skin:    Findings: No rash.  Neurological:     Mental Status: He is alert.     ED Results / Procedures / Treatments   Labs (all labs ordered are listed, but only abnormal results are displayed) Labs Reviewed  CBC WITH DIFFERENTIAL/PLATELET  COMPREHENSIVE METABOLIC PANEL  LIPASE, BLOOD  URINALYSIS, ROUTINE W REFLEX MICROSCOPIC    EKG None  Radiology No results found.  Procedures Procedures    Medications Ordered in ED Medications - No data to display  ED Course/ Medical Decision Making/ A&P                             Medical Decision Making Amount and/or Complexity of Data Reviewed Labs: ordered. Radiology: ordered.  Risk Prescription drug management.   BP 125/75 (BP Location: Right Wrist)   Pulse 77   Temp 97.9 F (36.6 C) (Oral)   Resp 18   Wt 63 kg   SpO2 100%   BMI 19.37 kg/m   39:90 AM  67 year old male prior history of TB, presenting here with complaint of abdominal pain.  Patient  endorses cramping epigastric pain with associate nausea and vomiting that started early this morning.  Normal bowel movement.  States that he ate [place] food last night.  States that he brought home but it was sent out for little bit and that he repeat the food before eating.  Does not endorse any fever or chills no chest pain or shortness of breath no cough no urinary symptoms.  States pain is mild at this time.  No recent medication change.  Denies alcohol or tobacco use.  On exam this is a well-appearing elderly male resting comfortably in bed appears to be in no acute discomfort.  Heart with normal rate and rhythm, lungs clear to auscultation bilaterally abdomen is soft with some mild epigastric tenderness but no guarding or rebound tenderness.  Negative Murphy sign, no pain at McBurney's point.  Vital signs reviewed and overall reassuring no fever no hypoxia.  Workup initiated which will include abdominal pelvis CT scan however I suspect patient abdominal discomfort is likely secondary to GI upset from food poisoning.  Patient given IV fluid, antiemetic, and pain medication.  -Labs ordered, independently viewed and interpreted by me.  Labs remarkable for noted WBC, normal H&H, electrolyte panels are reassuring, normal lipase, urinalysis without signs of urinary tract infection. -The patient was maintained on a cardiac monitor.  I personally viewed and interpreted the cardiac monitored which showed an underlying rhythm of: Sinus bradycardia -Imaging including abdominal pelvis CT scan was ordered however patient refused the CT scan. -This patient presents to the ED for concern of abdominal pain, this involves an extensive number of treatment options, and is a complaint that carries with it a high risk of complications and morbidity.  The differential diagnosis includes viral GI etiology, appendicitis, colitis, diverticulitis, acute cholecystitis, pancreatitis, UTI, kidney stone -Co morbidities that  complicate the patient evaluation includes TB -Treatment includes morphine, Zofran, IV fluid -Nurse informed that patient has to go to take care of his wife and has left AMA after he refused CT scan.  At this time I felt patient is stable.         Final Clinical Impression(s) / ED Diagnoses Final diagnoses:  Epigastric pain    Rx / DC Orders ED Discharge Orders     None         Fayrene Helper, PA-C 02/14/23 1128    Arby Barrette, MD 02/18/23 1311

## 2023-02-14 NOTE — ED Notes (Signed)
The patient was taken to CT. Once there, he refused the procedure, stated he did not have time because he had to go pick up his wife. When he returned to his room, he requested to leave AMA. He could not be convinced to stay. We did tell him to return if symptoms worsened or did not go away.

## 2023-04-12 ENCOUNTER — Emergency Department (HOSPITAL_COMMUNITY)
Admission: EM | Admit: 2023-04-12 | Discharge: 2023-04-12 | Disposition: A | Discharge status: Home / Self Care | Payer: Medicare Other | Attending: Emergency Medicine | Admitting: Emergency Medicine

## 2023-04-12 DIAGNOSIS — H9201 Otalgia, right ear: Secondary | ICD-10-CM | POA: Diagnosis present

## 2023-04-12 DIAGNOSIS — H6121 Impacted cerumen, right ear: Secondary | ICD-10-CM

## 2023-04-12 MED ORDER — SODIUM CHLORIDE 0.9 % IR SOLN: Freq: Once | Status: AC

## 2023-04-12 MED ORDER — HYDROGEN PEROXIDE 3 % EX SOLN
CUTANEOUS | Status: AC
  Filled 2023-04-12: qty 473

## 2023-04-12 NOTE — ED Provider Notes (Signed)
[place] EMERGENCY DEPARTMENT AT [facility] Provider Note   CSN: [id number] Arrival date & time: 04/12/23  0407     History  Chief Complaint  Patient presents with   Otalgia    Jerome Hampton is a 67 y.o. male.  67 yo M with a chief complaints of right ear pain.  Been going on for couple days.  He denies swimming denies scuba diving denies anything stuck in the ear.  He had  this previously and thought it was earwax.  He had it cleaned out and would like to have that done again.   Otalgia      Home Medications Prior to Admission medications   Medication Sig Start Date End Date Taking? Authorizing Provider  acetaminophen (TYLENOL) 325 MG tablet Take 650 mg by mouth every 6 (six) hours as needed for mild pain, fever or headache.    [provider]  albuterol (PROVENTIL HFA;VENTOLIN HFA) 108 (90 BASE) MCG/ACT inhaler Inhale 2 puffs into the lungs every 4 (four) hours as needed for wheezing or shortness of breath. Patient not taking: Reported on 08/08/2020 11/25/14   Jaynie Crumble, PA-C  benzonatate (TESSALON) 100 MG capsule Take 1 capsule (100 mg total) by mouth every 8 (eight) hours. Patient not taking: Reported on 08/08/2020 11/25/14   Jaynie Crumble, PA-C  naproxen (NAPROSYN) 500 MG tablet Take 1 tablet (500 mg total) by mouth 2 (two) times daily. 09/01/21   Achille Rich, PA-C      Allergies    Patient has no known allergies.    Review of Systems   Review of Systems  HENT:  Positive for ear pain.     Physical Exam Updated Vital Signs BP 101/73 (BP Location: Left Arm)   Pulse 72   Temp 97.7 F (36.5 C) (Oral)   Resp 17   SpO2 100%  Physical Exam Vitals and nursing note reviewed.  Constitutional:      Appearance: He is well-developed.  HENT:     Head: Normocephalic and atraumatic.     Ears:     Comments: Wax impaction bilaterally Eyes:     Pupils: Pupils are equal, round, and reactive to light.  Neck:     Vascular: No JVD.   Cardiovascular:     Rate and Rhythm: Normal rate and regular rhythm.     Heart sounds: No murmur heard.    No friction rub. No gallop.  Pulmonary:     Effort: No respiratory distress.     Breath sounds: No wheezing.  Abdominal:     General: There is no distension.     Tenderness: There is no abdominal tenderness. There is no guarding or rebound.  Musculoskeletal:        General: Normal range of motion.     Cervical back: Normal range of motion and neck supple.  Skin:    Coloration: Skin is not pale.     Findings: No rash.  Neurological:     Mental Status: He is alert and oriented to person, place, and time.  Psychiatric:        Behavior: Behavior normal.     ED Results / Procedures / Treatments   Labs (all labs ordered are listed, but only abnormal results are displayed) Labs Reviewed - No data to display  EKG None  Radiology No results found.  Procedures Procedures    Medications Ordered in ED Medications  hydrogen peroxide 3 % external solution (  Given 04/12/23 0447)  hydrogen peroxide  3 % 10 Application in sodium chloride irrigation 0.9 % 500 mL irrigation ( Irrigation Given 04/12/23 0447)    ED Course/ Medical Decision Making/ A&P                             Medical Decision Making  67 yo M with a cc of R ear pain.  Patient states that he had symptoms like this before when he had to have wax removed.  Clinically does have wax impaction.  Will have nursing irrigate.  Patient feeling better.  D/c home.  5:36 AM:  I have discussed the diagnosis/risks/treatment options with the patient.  Evaluation and diagnostic testing in the emergency department does not suggest an emergent condition requiring admission or immediate intervention beyond what has been performed at this time.  They will follow up with PCP. We also discussed returning to the ED immediately if new or worsening sx occur. We discussed the sx which are most concerning (e.g., sudden worsening pain,  fever, inability to tolerate by mouth) that necessitate immediate return. Medications administered to the patient during their visit and any new prescriptions provided to the patient are listed below.  Medications given during this visit Medications  hydrogen peroxide 3 % external solution (  Given 04/12/23 0447)  hydrogen peroxide 3 % 10 Application in sodium chloride irrigation 0.9 % 500 mL irrigation ( Irrigation Given 04/12/23 0447)     The patient appears reasonably screen and/or stabilized for discharge and I doubt any other medical condition or other [facility] requiring further screening, evaluation, or treatment in the ED at this time prior to discharge.          Final Clinical Impression(s) / ED Diagnoses Final diagnoses:  Impacted cerumen of right ear    Rx / DC Orders ED Discharge Orders     None         Melene Plan, DO 04/12/23 1610

## 2023-04-12 NOTE — ED Notes (Signed)
Pts ears irrigated with luke warm water and hydrogen peroxide, Qtip used to extract visible wax. Pt tolerated well

## 2023-04-12 NOTE — Discharge Instructions (Signed)
You can try debrox drops, they can help dissolve the wax.  Follow up with your PCP.

## 2023-04-12 NOTE — ED Notes (Signed)
Discharge papers reviewed with pt, pt verbalized understanding. Ambulatory from ED 

## 2023-04-12 NOTE — ED Triage Notes (Signed)
Patient here from home reporting right sided ear pain since yesterday.
